# Patient Record
Sex: Female | Born: 1950 | Race: White | Hispanic: No | Smoking: Current every day smoker
Health system: Southern US, Community
[De-identification: ages and names within clinical notes are randomized; demographics above are authoritative.]

## PROBLEM LIST (undated history)

## (undated) DIAGNOSIS — Z9981 Dependence on supplemental oxygen: Secondary | ICD-10-CM

## (undated) DIAGNOSIS — I509 Heart failure, unspecified: Secondary | ICD-10-CM

## (undated) DIAGNOSIS — I2510 Atherosclerotic heart disease of native coronary artery without angina pectoris: Secondary | ICD-10-CM

## (undated) DIAGNOSIS — R519 Headache, unspecified: Secondary | ICD-10-CM

## (undated) DIAGNOSIS — R51 Headache: Secondary | ICD-10-CM

## (undated) DIAGNOSIS — G459 Transient cerebral ischemic attack, unspecified: Secondary | ICD-10-CM

## (undated) DIAGNOSIS — E119 Type 2 diabetes mellitus without complications: Secondary | ICD-10-CM

## (undated) DIAGNOSIS — D649 Anemia, unspecified: Secondary | ICD-10-CM

## (undated) DIAGNOSIS — I6350 Cerebral infarction due to unspecified occlusion or stenosis of unspecified cerebral artery: Secondary | ICD-10-CM

## (undated) DIAGNOSIS — E7800 Pure hypercholesterolemia, unspecified: Secondary | ICD-10-CM

## (undated) DIAGNOSIS — IMO0001 Reserved for inherently not codable concepts without codable children: Secondary | ICD-10-CM

## (undated) DIAGNOSIS — J189 Pneumonia, unspecified organism: Secondary | ICD-10-CM

## (undated) DIAGNOSIS — I639 Cerebral infarction, unspecified: Secondary | ICD-10-CM

## (undated) DIAGNOSIS — I10 Essential (primary) hypertension: Secondary | ICD-10-CM

## (undated) DIAGNOSIS — I219 Acute myocardial infarction, unspecified: Secondary | ICD-10-CM

## (undated) HISTORY — DX: Anemia, unspecified: D64.9

## (undated) HISTORY — PX: CORONARY ANGIOPLASTY WITH STENT PLACEMENT: SHX49

## (undated) HISTORY — PX: GLAUCOMA SURGERY: SHX656

## (undated) HISTORY — PX: EYE SURGERY: SHX253

## (undated) HISTORY — PX: CATARACT EXTRACTION W/ INTRAOCULAR LENS  IMPLANT, BILATERAL: SHX1307

## (undated) HISTORY — DX: Cerebral infarction due to unspecified occlusion or stenosis of unspecified cerebral artery: I63.50

## (undated) HISTORY — PX: LEG SURGERY: SHX1003

## (undated) HISTORY — PX: TUBAL LIGATION: SHX77

## (undated) HISTORY — DX: Heart failure, unspecified: I50.9

## (undated) HISTORY — DX: Transient cerebral ischemic attack, unspecified: G45.9

## (undated) HISTORY — DX: Atherosclerotic heart disease of native coronary artery without angina pectoris: I25.10

---

## 2008-08-30 DIAGNOSIS — I639 Cerebral infarction, unspecified: Secondary | ICD-10-CM | POA: Insufficient documentation

## 2008-08-30 HISTORY — DX: Cerebral infarction, unspecified: I63.9

## 2011-04-20 DIAGNOSIS — I739 Peripheral vascular disease, unspecified: Secondary | ICD-10-CM | POA: Insufficient documentation

## 2011-04-20 HISTORY — DX: Peripheral vascular disease, unspecified: I73.9

## 2011-12-01 DIAGNOSIS — H540X33 Blindness right eye category 3, blindness left eye category 3: Secondary | ICD-10-CM

## 2011-12-01 HISTORY — DX: Blindness right eye category 3, blindness left eye category 3: H54.0X33

## 2012-02-22 DIAGNOSIS — Z79899 Other long term (current) drug therapy: Secondary | ICD-10-CM

## 2012-02-22 HISTORY — DX: Other long term (current) drug therapy: Z79.899

## 2015-10-15 DIAGNOSIS — R600 Localized edema: Secondary | ICD-10-CM

## 2015-10-15 DIAGNOSIS — R609 Edema, unspecified: Secondary | ICD-10-CM

## 2015-10-15 HISTORY — DX: Edema, unspecified: R60.9

## 2015-10-15 HISTORY — DX: Localized edema: R60.0

## 2016-01-07 ENCOUNTER — Encounter (HOSPITAL_COMMUNITY): Payer: Self-pay | Admitting: General Practice

## 2016-01-07 ENCOUNTER — Inpatient Hospital Stay (HOSPITAL_COMMUNITY)
Admission: AD | Admit: 2016-01-07 | Discharge: 2016-01-10 | DRG: 125 | Disposition: A | Discharge status: Home / Self Care | Payer: Medicare HMO | Source: Other Acute Inpatient Hospital | Attending: Internal Medicine | Admitting: Internal Medicine

## 2016-01-07 DIAGNOSIS — Z9841 Cataract extraction status, right eye: Secondary | ICD-10-CM

## 2016-01-07 DIAGNOSIS — F17210 Nicotine dependence, cigarettes, uncomplicated: Secondary | ICD-10-CM | POA: Diagnosis present

## 2016-01-07 DIAGNOSIS — E1139 Type 2 diabetes mellitus with other diabetic ophthalmic complication: Secondary | ICD-10-CM | POA: Diagnosis not present

## 2016-01-07 DIAGNOSIS — H05019 Cellulitis of unspecified orbit: Secondary | ICD-10-CM | POA: Diagnosis present

## 2016-01-07 DIAGNOSIS — Z79899 Other long term (current) drug therapy: Secondary | ICD-10-CM | POA: Diagnosis not present

## 2016-01-07 DIAGNOSIS — Z794 Long term (current) use of insulin: Secondary | ICD-10-CM

## 2016-01-07 DIAGNOSIS — Z9981 Dependence on supplemental oxygen: Secondary | ICD-10-CM

## 2016-01-07 DIAGNOSIS — H5462 Unqualified visual loss, left eye, normal vision right eye: Secondary | ICD-10-CM | POA: Diagnosis present

## 2016-01-07 DIAGNOSIS — Z9842 Cataract extraction status, left eye: Secondary | ICD-10-CM

## 2016-01-07 DIAGNOSIS — Z7982 Long term (current) use of aspirin: Secondary | ICD-10-CM

## 2016-01-07 DIAGNOSIS — Z8673 Personal history of transient ischemic attack (TIA), and cerebral infarction without residual deficits: Secondary | ICD-10-CM

## 2016-01-07 DIAGNOSIS — I10 Essential (primary) hypertension: Secondary | ICD-10-CM | POA: Diagnosis present

## 2016-01-07 DIAGNOSIS — Z7902 Long term (current) use of antithrombotics/antiplatelets: Secondary | ICD-10-CM

## 2016-01-07 DIAGNOSIS — E113599 Type 2 diabetes mellitus with proliferative diabetic retinopathy without macular edema, unspecified eye: Secondary | ICD-10-CM | POA: Diagnosis present

## 2016-01-07 DIAGNOSIS — Z888 Allergy status to other drugs, medicaments and biological substances status: Secondary | ICD-10-CM | POA: Diagnosis not present

## 2016-01-07 DIAGNOSIS — H05012 Cellulitis of left orbit: Secondary | ICD-10-CM

## 2016-01-07 DIAGNOSIS — H409 Unspecified glaucoma: Secondary | ICD-10-CM | POA: Diagnosis present

## 2016-01-07 DIAGNOSIS — H00034 Abscess of left upper eyelid: Principal | ICD-10-CM | POA: Diagnosis present

## 2016-01-07 DIAGNOSIS — I2510 Atherosclerotic heart disease of native coronary artery without angina pectoris: Secondary | ICD-10-CM | POA: Diagnosis not present

## 2016-01-07 DIAGNOSIS — Z961 Presence of intraocular lens: Secondary | ICD-10-CM | POA: Diagnosis present

## 2016-01-07 DIAGNOSIS — E785 Hyperlipidemia, unspecified: Secondary | ICD-10-CM | POA: Diagnosis present

## 2016-01-07 DIAGNOSIS — Z833 Family history of diabetes mellitus: Secondary | ICD-10-CM

## 2016-01-07 DIAGNOSIS — Z955 Presence of coronary angioplasty implant and graft: Secondary | ICD-10-CM

## 2016-01-07 DIAGNOSIS — Z8614 Personal history of Methicillin resistant Staphylococcus aureus infection: Secondary | ICD-10-CM | POA: Diagnosis not present

## 2016-01-07 DIAGNOSIS — I252 Old myocardial infarction: Secondary | ICD-10-CM | POA: Diagnosis not present

## 2016-01-07 DIAGNOSIS — H578 Other specified disorders of eye and adnexa: Secondary | ICD-10-CM | POA: Diagnosis present

## 2016-01-07 HISTORY — DX: Atherosclerotic heart disease of native coronary artery without angina pectoris: I25.10

## 2016-01-07 HISTORY — DX: Headache, unspecified: R51.9

## 2016-01-07 HISTORY — DX: Type 2 diabetes mellitus without complications: E11.9

## 2016-01-07 HISTORY — DX: Dependence on supplemental oxygen: Z99.81

## 2016-01-07 HISTORY — DX: Cerebral infarction, unspecified: I63.9

## 2016-01-07 HISTORY — DX: Essential (primary) hypertension: I10

## 2016-01-07 HISTORY — DX: Acute myocardial infarction, unspecified: I21.9

## 2016-01-07 HISTORY — DX: Type 2 diabetes mellitus with other diabetic ophthalmic complication: E11.39

## 2016-01-07 HISTORY — DX: Cellulitis of unspecified orbit: H05.019

## 2016-01-07 HISTORY — DX: Pneumonia, unspecified organism: J18.9

## 2016-01-07 HISTORY — DX: Pure hypercholesterolemia, unspecified: E78.00

## 2016-01-07 HISTORY — DX: Reserved for inherently not codable concepts without codable children: IMO0001

## 2016-01-07 HISTORY — DX: Headache: R51

## 2016-01-07 LAB — BASIC METABOLIC PANEL
Anion gap: 7 (ref 5–15)
BUN: 10 mg/dL (ref 6–20)
CO2: 30 mmol/L (ref 22–32)
CREATININE: 0.75 mg/dL (ref 0.44–1.00)
Calcium: 9.1 mg/dL (ref 8.9–10.3)
Chloride: 99 mmol/L — ABNORMAL LOW (ref 101–111)
Glucose, Bld: 266 mg/dL — ABNORMAL HIGH (ref 65–99)
POTASSIUM: 4 mmol/L (ref 3.5–5.1)
SODIUM: 136 mmol/L (ref 135–145)

## 2016-01-07 LAB — CBC
HEMATOCRIT: 32.7 % — AB (ref 36.0–46.0)
Hemoglobin: 10.5 g/dL — ABNORMAL LOW (ref 12.0–15.0)
MCH: 30.1 pg (ref 26.0–34.0)
MCHC: 32.1 g/dL (ref 30.0–36.0)
MCV: 93.7 fL (ref 78.0–100.0)
PLATELETS: 175 10*3/uL (ref 150–400)
RBC: 3.49 MIL/uL — AB (ref 3.87–5.11)
RDW: 13.6 % (ref 11.5–15.5)
WBC: 5.9 10*3/uL (ref 4.0–10.5)

## 2016-01-07 LAB — MRSA PCR SCREENING: MRSA by PCR: POSITIVE — AB

## 2016-01-07 LAB — PROTIME-INR
INR: 1.01 (ref 0.00–1.49)
Prothrombin Time: 13.5 seconds (ref 11.6–15.2)

## 2016-01-07 LAB — GLUCOSE, CAPILLARY: Glucose-Capillary: 258 mg/dL — ABNORMAL HIGH (ref 65–99)

## 2016-01-07 MED ORDER — ACETAMINOPHEN 325 MG PO TABS
650.0000 mg | ORAL_TABLET | Freq: Four times a day (QID) | ORAL | Status: DC | PRN
  Filled 2016-01-07: qty 2

## 2016-01-07 MED ORDER — VANCOMYCIN HCL IN DEXTROSE 750-5 MG/150ML-% IV SOLN
750.0000 mg | Freq: Two times a day (BID) | INTRAVENOUS | Status: DC
Start: 2016-01-08 — End: 2016-01-10
  Administered 2016-01-08 – 2016-01-09 (×4): 750 mg via INTRAVENOUS
  Filled 2016-01-07 (×8): qty 150

## 2016-01-07 MED ORDER — HYDROCODONE-ACETAMINOPHEN 5-325 MG PO TABS
1.0000 | ORAL_TABLET | ORAL | Status: DC | PRN
  Administered 2016-01-07: 1 via ORAL
  Administered 2016-01-08 (×2): 2 via ORAL
  Administered 2016-01-08: 1 via ORAL
  Administered 2016-01-08 – 2016-01-10 (×7): 2 via ORAL
  Filled 2016-01-07: qty 2
  Filled 2016-01-07 (×2): qty 1
  Filled 2016-01-07 (×8): qty 2

## 2016-01-07 MED ORDER — ALBUTEROL SULFATE (2.5 MG/3ML) 0.083% IN NEBU
2.5000 mg | INHALATION_SOLUTION | Freq: Once | RESPIRATORY_TRACT | Status: AC
  Administered 2016-01-07: 2.5 mg via RESPIRATORY_TRACT

## 2016-01-07 MED ORDER — MORPHINE SULFATE (PF) 2 MG/ML IV SOLN: 2.0000 mg | INTRAVENOUS | Status: DC | PRN

## 2016-01-07 MED ORDER — PIPERACILLIN-TAZOBACTAM 3.375 G IVPB
3.3750 g | Freq: Three times a day (TID) | INTRAVENOUS | Status: DC
  Administered 2016-01-08 – 2016-01-10 (×7): 3.375 g via INTRAVENOUS
  Filled 2016-01-07 (×9): qty 50

## 2016-01-07 MED ORDER — SODIUM CHLORIDE 0.9 % IV SOLN
INTRAVENOUS | Status: DC
  Administered 2016-01-07: 21:00:00 via INTRAVENOUS

## 2016-01-07 MED ORDER — ONDANSETRON HCL 4 MG PO TABS: 4.0000 mg | ORAL_TABLET | Freq: Four times a day (QID) | ORAL | Status: DC | PRN

## 2016-01-07 MED ORDER — PIPERACILLIN-TAZOBACTAM 3.375 G IVPB 30 MIN
3.3750 g | Freq: Once | INTRAVENOUS | Status: AC
  Administered 2016-01-07: 3.375 g via INTRAVENOUS
  Filled 2016-01-07: qty 50

## 2016-01-07 MED ORDER — INSULIN ASPART 100 UNIT/ML ~~LOC~~ SOLN
0.0000 [IU] | SUBCUTANEOUS | Status: DC
  Administered 2016-01-07: 5 [IU] via SUBCUTANEOUS
  Administered 2016-01-08: 2 [IU] via SUBCUTANEOUS
  Administered 2016-01-08 (×3): 3 [IU] via SUBCUTANEOUS
  Administered 2016-01-08: 1 [IU] via SUBCUTANEOUS
  Administered 2016-01-08: 3 [IU] via SUBCUTANEOUS
  Administered 2016-01-09: 1 [IU] via SUBCUTANEOUS
  Administered 2016-01-09: 2 [IU] via SUBCUTANEOUS
  Administered 2016-01-09: 1 [IU] via SUBCUTANEOUS

## 2016-01-07 MED ORDER — CETYLPYRIDINIUM CHLORIDE 0.05 % MT LIQD
7.0000 mL | Freq: Two times a day (BID) | OROMUCOSAL | Status: DC
  Administered 2016-01-07: 7 mL via OROMUCOSAL

## 2016-01-07 MED ORDER — ACETAMINOPHEN 650 MG RE SUPP: 650.0000 mg | Freq: Four times a day (QID) | RECTAL | Status: DC | PRN

## 2016-01-07 MED ORDER — ONDANSETRON HCL 4 MG/2ML IJ SOLN: 4.0000 mg | Freq: Four times a day (QID) | INTRAMUSCULAR | Status: DC | PRN

## 2016-01-07 MED ORDER — VANCOMYCIN HCL IN DEXTROSE 1-5 GM/200ML-% IV SOLN
1000.0000 mg | Freq: Once | INTRAVENOUS | Status: AC
  Administered 2016-01-07: 1000 mg via INTRAVENOUS
  Filled 2016-01-07: qty 200

## 2016-01-07 MED ORDER — ALBUTEROL SULFATE (2.5 MG/3ML) 0.083% IN NEBU
2.5000 mg | INHALATION_SOLUTION | RESPIRATORY_TRACT | Status: DC | PRN
  Administered 2016-01-08: 2.5 mg via RESPIRATORY_TRACT
  Filled 2016-01-07 (×2): qty 3

## 2016-01-07 NOTE — H&P (Signed)
Brooke Hudson [id number] DOB: 11/13/1950 DOA: 01/07/2016     PCP: Charline Bills, MD    Patient coming from: [facility]  Chief Complaint: Eye swelling  HPI: Brooke Hudson is a 65 y.o. female with medical history significant of DM, stroke, HTN, CAD, MRSA abcess    Presented with 4-5 days of Left eye swelling low grade fever no chills associated with significant pain. She had a small pimple in the corner of the eye her grand Daughter mashed on it. Next day she presented to Er it was I&D but next day got worse had to go back to ER and I&D eye lid CT scan showed abscess. Started on [employer] was consultedShe was sent to [employer] [facility]    Regarding pertinent Chronic problems: DM last Hg A1C was 8.7 not under goo control reports hx  Of stroke in 2010 no residual deficits.  hx of cellulitis with MRSA years ago. HX of CAD with coronary stent 15 years ago have been doing well asymptomatic since.  Continues to smoke may have mild COPD and have been started on albuterol PRN      Hospitalist was called for admission for Orbital celluliits  Review of Systems:    Pertinent positives include: Fevers, shortness of breath at rest, wheezing.  Constitutional:  No weight loss, night sweats, chills, fatigue, weight loss  HEENT:  No headaches, Difficulty swallowing,Tooth/dental problems,Sore throat,  No sneezing, itching, ear ache, nasal congestion, post nasal drip,  Cardio-vascular:  No chest pain, Orthopnea, PND, anasarca, dizziness, palpitations.no Bilateral lower extremity swelling  GI:  No heartburn, indigestion, abdominal pain, nausea, vomiting, diarrhea, change in bowel habits, loss of appetite, melena, blood in stool, hematemesis Resp:  no . No dyspnea on exertion, No excess mucus, no productive cough, No non-productive cough, No coughing up of blood.No change in color of mucus.No  Skin:  no rash or lesions. No jaundice GU:  no dysuria, change in color of  urine, no urgency or frequency. No straining to urinate.  No flank pain.  Musculoskeletal:  No joint pain or no joint swelling. No decreased range of motion. No back pain.  Psych:  No change in mood or affect. No depression or anxiety. No memory loss.  Neuro: no localizing neurological complaints, no tingling, no weakness, no double vision, no gait abnormality, no slurred speech, no confusion  As per HPI otherwise 10 point review of systems negative.   Past Medical History: Past Medical History  Diagnosis Date  . Hypertension   . Hypercholesterolemia   . Coronary artery disease   . Myocardial infarction ([place]) ~ 2005  . Shortness of breath dyspnea     "alot" (01/07/2016)  . Pneumonia ~ 2016  . On home oxygen therapy     "2L just at night" (01/07/2016)  . Type II diabetes mellitus ([place])   . Daily headache     "lately daily; normally it's weekly" (01/07/2016)   Past Surgical History  Procedure Laterality Date  . Eye surgery    . Glaucoma surgery Bilateral   . Cataract extraction w/ intraocular lens  implant, bilateral Bilateral   . Cesarean section  1980  . Tubal ligation    . Coronary angioplasty with stent placement  ~ 2005 X 2    "put 5 stents in w/in 1 wk; [place]"     Social History:  Ambulatory   walker  Lives at home  With family     reports that she has been smoking  Cigarettes.  She has a 18 pack-year smoking history. She has never used smokeless tobacco. She reports that she does not drink alcohol or use illicit drugs.  Allergies:   Allergies  Allergen Reactions  . Bactrim [Sulfamethoxazole-Trimethoprim] Other (See Comments)    Dehydrates and disorients/ delusions  . Benzocaine Rash       Family History:  Family History  Problem Relation Age of Onset  . Diabetes Mother   . Diabetes Sister   . Diabetes Other     Medications: Prior to Admission medications   Not on File    Physical Exam: Patient Vitals for the past 24 hrs:  BP Temp Temp  src Pulse Resp SpO2 Height Weight  01/07/16 1826 (!) 136/45 mmHg 98.2 F (36.8 C) Axillary 73 19 92 % 5\' 2"  (1.575 m) 74.844 kg (165 lb)    1. General:  in No Acute distress 2. Psychological: Alert and  Oriented 3. Head/ENT:    Dry Mucous Membranes                          Head Non traumatic, neck supple                          Normal  Dentition 4. SKIN: normal   Skin turgor,  Skin clean Dry with redness, swelling of left eyelid    5. Heart: Regular rate and rhythm   Murmur, Rub or gallop 6. Lungs:  no wheezes or crackles, distant breathsounds 7. Abdomen: Soft, non-tender, Non distended 8. Lower extremities: no clubbing, cyanosis, or edema 9. Neurologically Grossly intact, moving all 4 extremities equally 10. MSK: Normal range of motion   body mass index is 30.17 kg/(m^2).  Labs on Admission:   Labs on Admission: I have personally reviewed following labs and imaging studies  CBC: No results for input(s): WBC, NEUTROABS, HGB, HCT, MCV, PLT in the last 168 hours. Basic Metabolic Panel: No results for input(s): NA, K, CL, CO2, GLUCOSE, BUN, CREATININE, CALCIUM, MG, PHOS in the last 168 hours. GFR: CrCl cannot be calculated (Patient has no serum creatinine result on file.). Liver Function Tests: No results for input(s): AST, ALT, ALKPHOS, BILITOT, PROT, ALBUMIN in the last 168 hours. No results for input(s): LIPASE, AMYLASE in the last 168 hours. No results for input(s): AMMONIA in the last 168 hours. Coagulation Profile: No results for input(s): INR, PROTIME in the last 168 hours. Cardiac Enzymes: No results for input(s): CKTOTAL, CKMB, CKMBINDEX, TROPONINI in the last 168 hours. BNP (last 3 results) No results for input(s): PROBNP in the last 8760 hours. HbA1C: No results for input(s): HGBA1C in the last 72 hours. CBG: No results for input(s): GLUCAP in the last 168 hours. Lipid Profile: No results for input(s): CHOL, HDL, LDLCALC, TRIG, CHOLHDL, LDLDIRECT in the last  72 hours. Thyroid Function Tests: No results for input(s): TSH, T4TOTAL, FREET4, T3FREE, THYROIDAB in the last 72 hours. Anemia Panel: No results for input(s): VITAMINB12, FOLATE, FERRITIN, TIBC, IRON, RETICCTPCT in the last 72 hours. Urine analysis: No results found for: COLORURINE, APPEARANCEUR, LABSPEC, PHURINE, GLUCOSEU, HGBUR, BILIRUBINUR, KETONESUR, PROTEINUR, UROBILINOGEN, NITRITE, LEUKOCYTESUR Sepsis Labs: @LABRCNTIP (procalcitonin:4,lacticidven:4) )No results found for this or any previous visit (from the past 240 hour(s)).     No results found for: HGBA1C  CrCl cannot be calculated (Patient has no serum creatinine result on file.).  BNP (last 3 results) No results for input(s): PROBNP in the last 8760  hours.   ECG REPORT Not obtained  Filed Weights   01/07/16 1826  Weight: 74.844 kg (165 lb)     Cultures: No results found for: SDES, SPECREQUEST, CULT, REPTSTATUS   Radiological Exams on Admission: No results found.  Chart has been reviewed    Assessment/Plan  65 y.o. female with medical history significant of DM, stroke, HTN, CAD, MRSA abscess here with Left eyelid cellulitis/abcess   Present on Admission:  . Orbital cellulitis  - discussed with opthalmology, continue with IV Sent in Zosyn obtained blood culture and MRSA plan to observe on IV antibiotics if continues to get worse or does not improve will need receiving to mention the next few days  . Essential hypertension - continue home medications, Patient unsure what she takes for now will restart amlodipine but will need to confirm her home medication list  . DM type 2 causing eye disease, not at goal [facility]) with by mouth medications order sliding scale  . CAD (coronary artery disease), native coronary artery currently stable, continue home medications  Other plan as per orders.  DVT prophylaxis:  SCD    Code Status:  FULL CODE as per patient   Family Communication:   Family  at  Bedside  plan of care  was discussed with grand Daughter Aldona Bar  Disposition Plan:    To home once workup is complete and patient is stable   Consults called: opthalmology    Admission status:   inpatient       Level of care     medical floor        I have spent a total of 65 min on this admission   extra time was spent to discuss case with Dr. Roselee Nova 01/07/2016, 8:46 PM    [facility]  Pager [phone]   after 2 AM please page floor coverage PA If 7AM-7PM, please contact the day team taking care of the patient  [email or web address]  Password [email or web address]

## 2016-01-07 NOTE — Progress Notes (Signed)
Pharmacy Antibiotic Note  Brooke Hudson is a 65 y.o. female admitted on 01/07/2016 with cellulitis.  Pharmacy has been consulted for vancomycin and zosyn dosing. Pt presents with eye swelling.  Pt received vancomycin 1g and zosyn 3.375g IV once.  Plan: Vancomycin 750mg  IV every 12 hours.  Goal trough 10-15 mcg/mL. Zosyn 3.375g IV q8h (4 hour infusion).  Monitor culture data, renal function and clinical course VT at SS prn  Height: 5\' 2"  (157.5 cm) Weight: 165 lb (74.844 kg) IBW/kg (Calculated) : 50.1  Temp (24hrs), Avg:98.2 F (36.8 C), Min:98.2 F (36.8 C), Max:98.2 F (36.8 C)  No results for input(s): WBC, CREATININE, LATICACIDVEN, VANCOTROUGH, VANCOPEAK, VANCORANDOM, GENTTROUGH, GENTPEAK, GENTRANDOM, TOBRATROUGH, TOBRAPEAK, TOBRARND, AMIKACINPEAK, AMIKACINTROU, AMIKACIN in the last 168 hours.  CrCl cannot be calculated (Patient has no serum creatinine result on file.).    Allergies  Allergen Reactions  . Bactrim [Sulfamethoxazole-Trimethoprim] Other (See Comments)    Dehydrates and disorients/ delusions  . Benzocaine Rash    Antimicrobials this admission: Vanc 5/10 >>  Zosyn 5/10 >>   Dose adjustments this admission: n/a  Microbiology results:  BCx:   UCx:    Sputum:   5/10 MRSA PCR: sent   Andrey Cota. Diona Foley, PharmD, [place] Clinical Pharmacist Pager [phone] 01/07/2016 7:57 PM

## 2016-01-08 ENCOUNTER — Other Ambulatory Visit: Payer: Self-pay | Admitting: Ophthalmology

## 2016-01-08 LAB — CBC
HCT: 31.1 % — ABNORMAL LOW (ref 36.0–46.0)
HEMATOCRIT: 30.6 % — AB (ref 36.0–46.0)
HEMATOCRIT: 33.5 % — AB (ref 36.0–46.0)
HEMATOCRIT: 33 % — AB (ref 36.0–46.0)
HEMOGLOBIN: 10.8 g/dL — AB (ref 12.0–15.0)
Hemoglobin: 10.4 g/dL — ABNORMAL LOW (ref 12.0–15.0)
Hemoglobin: 10 g/dL — ABNORMAL LOW (ref 12.0–15.0)
Hemoglobin: 11 g/dL — ABNORMAL LOW (ref 12.0–15.0)
MCH: 31.6 pg (ref 26.0–34.0)
MCH: 30.6 pg (ref 26.0–34.0)
MCH: 31.2 pg (ref 26.0–34.0)
MCH: 31.3 pg (ref 26.0–34.0)
MCHC: 33.4 g/dL (ref 30.0–36.0)
MCHC: 32.7 g/dL (ref 30.0–36.0)
MCHC: 32.8 g/dL (ref 30.0–36.0)
MCHC: 32.7 g/dL (ref 30.0–36.0)
MCV: 94.5 fL (ref 78.0–100.0)
MCV: 93.6 fL (ref 78.0–100.0)
MCV: 94.9 fL (ref 78.0–100.0)
MCV: 95.7 fL (ref 78.0–100.0)
PLATELETS: 166 10*3/uL (ref 150–400)
PLATELETS: 162 10*3/uL (ref 150–400)
PLATELETS: 184 10*3/uL (ref 150–400)
Platelets: 180 10*3/uL (ref 150–400)
RBC: 3.29 MIL/uL — ABNORMAL LOW (ref 3.87–5.11)
RBC: 3.27 MIL/uL — ABNORMAL LOW (ref 3.87–5.11)
RBC: 3.53 MIL/uL — ABNORMAL LOW (ref 3.87–5.11)
RBC: 3.45 MIL/uL — AB (ref 3.87–5.11)
RDW: 13.5 % (ref 11.5–15.5)
RDW: 13.5 % (ref 11.5–15.5)
RDW: 13.4 % (ref 11.5–15.5)
RDW: 13.5 % (ref 11.5–15.5)
WBC: 6.1 10*3/uL (ref 4.0–10.5)
WBC: 5.9 10*3/uL (ref 4.0–10.5)
WBC: 6.2 10*3/uL (ref 4.0–10.5)
WBC: 6 10*3/uL (ref 4.0–10.5)

## 2016-01-08 LAB — BASIC METABOLIC PANEL
ANION GAP: 10 (ref 5–15)
ANION GAP: 11 (ref 5–15)
Anion gap: 11 (ref 5–15)
BUN: 9 mg/dL (ref 6–20)
BUN: 8 mg/dL (ref 6–20)
BUN: 7 mg/dL (ref 6–20)
CALCIUM: 8.9 mg/dL (ref 8.9–10.3)
CALCIUM: 9 mg/dL (ref 8.9–10.3)
CALCIUM: 8.9 mg/dL (ref 8.9–10.3)
CHLORIDE: 101 mmol/L (ref 101–111)
CO2: 27 mmol/L (ref 22–32)
CO2: 27 mmol/L (ref 22–32)
CO2: 28 mmol/L (ref 22–32)
CREATININE: 0.72 mg/dL (ref 0.44–1.00)
CREATININE: 0.63 mg/dL (ref 0.44–1.00)
Chloride: 100 mmol/L — ABNORMAL LOW (ref 101–111)
Chloride: 101 mmol/L (ref 101–111)
Creatinine, Ser: 0.69 mg/dL (ref 0.44–1.00)
GFR calc Af Amer: >60 mL/min (ref >60)
GFR calc Af Amer: >60 mL/min (ref >60)
GFR calc Af Amer: >60 mL/min (ref >60)
GFR calc non Af Amer: >60 mL/min (ref >60)
GFR calc non Af Amer: >60 mL/min (ref >60)
GLUCOSE: 229 mg/dL — AB (ref 65–99)
GLUCOSE: 162 mg/dL — AB (ref 65–99)
GLUCOSE: 254 mg/dL — AB (ref 65–99)
Potassium: 3.8 mmol/L (ref 3.5–5.1)
Potassium: 4 mmol/L (ref 3.5–5.1)
Potassium: 4.2 mmol/L (ref 3.5–5.1)
Sodium: 137 mmol/L (ref 135–145)
Sodium: 139 mmol/L (ref 135–145)
Sodium: 140 mmol/L (ref 135–145)

## 2016-01-08 LAB — COMPREHENSIVE METABOLIC PANEL
ALBUMIN: 2.4 g/dL — AB (ref 3.5–5.0)
ALT: 11 U/L — AB (ref 14–54)
AST: 11 U/L — AB (ref 15–41)
Alkaline Phosphatase: 79 U/L (ref 38–126)
Anion gap: 8 (ref 5–15)
BUN: 8 mg/dL (ref 6–20)
CO2: 30 mmol/L (ref 22–32)
CREATININE: 0.68 mg/dL (ref 0.44–1.00)
Calcium: 8.9 mg/dL (ref 8.9–10.3)
Chloride: 102 mmol/L (ref 101–111)
GFR calc Af Amer: >60 mL/min (ref >60)
GLUCOSE: 154 mg/dL — AB (ref 65–99)
POTASSIUM: 3.7 mmol/L (ref 3.5–5.1)
Sodium: 140 mmol/L (ref 135–145)
Total Bilirubin: 0.4 mg/dL (ref 0.3–1.2)
Total Protein: 6.5 g/dL (ref 6.5–8.1)

## 2016-01-08 LAB — GLUCOSE, CAPILLARY
GLUCOSE-CAPILLARY: 143 mg/dL — AB (ref 65–99)
GLUCOSE-CAPILLARY: 229 mg/dL — AB (ref 65–99)
GLUCOSE-CAPILLARY: 212 mg/dL — AB (ref 65–99)
GLUCOSE-CAPILLARY: 217 mg/dL — AB (ref 65–99)
Glucose-Capillary: 216 mg/dL — ABNORMAL HIGH (ref 65–99)
Glucose-Capillary: 151 mg/dL — ABNORMAL HIGH (ref 65–99)

## 2016-01-08 LAB — TSH: TSH: 3.151 u[IU]/mL (ref 0.350–4.500)

## 2016-01-08 LAB — MAGNESIUM: Magnesium: 2.1 mg/dL (ref 1.7–2.4)

## 2016-01-08 LAB — PHOSPHORUS: Phosphorus: 2.8 mg/dL (ref 2.5–4.6)

## 2016-01-08 MED ORDER — MUPIROCIN 2 % EX OINT
1.0000 "application " | TOPICAL_OINTMENT | Freq: Two times a day (BID) | CUTANEOUS | Status: DC
  Administered 2016-01-08 – 2016-01-09 (×4): 1 via NASAL
  Filled 2016-01-08: qty 22

## 2016-01-08 MED ORDER — ERYTHROMYCIN 5 MG/GM OP OINT
TOPICAL_OINTMENT | Freq: Three times a day (TID) | OPHTHALMIC | Status: DC
  Administered 2016-01-08: 1 via OPHTHALMIC
  Administered 2016-01-09 – 2016-01-10 (×5): via OPHTHALMIC
  Filled 2016-01-08 (×2): qty 3.5

## 2016-01-08 MED ORDER — DIPHENHYDRAMINE HCL 25 MG PO CAPS
25.0000 mg | ORAL_CAPSULE | Freq: Four times a day (QID) | ORAL | Status: DC | PRN
  Administered 2016-01-08 – 2016-01-10 (×4): 25 mg via ORAL
  Filled 2016-01-08 (×4): qty 1

## 2016-01-08 MED ORDER — AMLODIPINE BESYLATE 5 MG PO TABS
5.0000 mg | ORAL_TABLET | Freq: Every day | ORAL | Status: DC
  Administered 2016-01-08 – 2016-01-10 (×3): 5 mg via ORAL
  Filled 2016-01-08 (×3): qty 1

## 2016-01-08 MED ORDER — CHLORHEXIDINE GLUCONATE CLOTH 2 % EX PADS
6.0000 | MEDICATED_PAD | Freq: Every day | CUTANEOUS | Status: DC
  Administered 2016-01-08 – 2016-01-10 (×3): 6 via TOPICAL

## 2016-01-08 MED ORDER — ERYTHROMYCIN 5 MG/GM OP OINT: TOPICAL_OINTMENT | Freq: Three times a day (TID) | OPHTHALMIC | Status: AC

## 2016-01-08 NOTE — Progress Notes (Signed)
Patient ID: Brooke Hudson, female   DOB: 1951/07/29, 65 y.o.   MRN: [id number]    PROGRESS NOTE    Brooke Hudson  [id number] DOB: 1951/02/13 DOA: 01/07/2016  PCP: Charline Bills, MD   Brief Narrative:  65 y.o. female with DM, stroke in 2010 with no residual deficits, HTN, CAD with coronary stent placed 15 yrs ago and no specific problems since that time, MRSA abscess, presented to [facility] ED with main concern of 4-5 days duration of progressively worsening left eye swelling, associated with fevers, chills, pain and itching. She apparently had CT of the eye done and was notable for an abscess (not uploaded in the system yet). Pt was started on vancomycin and zosyn and ophthalmology consulted. [facility] asked to admit for further evaluation.   Assessment & Plan:   Active Problems:   Orbital cellulitis, left - severe with significant surrounding erythema and edema also extending around right orbital area  - continue vancomycin and zosyn day #2 - follow up on blood cultures - emphasized importance of glucose control - ophthalmologist on call to review images once uploaded into the system, order placed to have the images imported STAT - NPO after midnight in case surgical intervention is required  - pt made aware of the plan     Essential hypertension - continue Norvasc as per home medical regimen     DM type 2 causing eye disease, not at goal [facility]) - continue SSI for now     CAD (coronary artery disease), native coronary artery - asymptomatic   DVT prophylaxis: SCD's Code Status: Full  Family Communication: Patient at bedside  Disposition Plan: Home once ophthalmology team clears   Consultants:   Ophthalmology    Procedures:   None  Antimicrobials:   Vancomycin 5/10 -->  Zosyn 5/10 -->  Subjective: Pt reports significant left eye pain and itching but says it feels better overall.   Objective: Filed Vitals:   01/07/16 1826 01/07/16 2231 01/07/16 2331 01/07/16 2347  BP:  136/45  105/54 162/50  Pulse: 73  71 78  Temp: 98.2 F (36.8 C)  98 F (36.7 C) 98.9 F (37.2 C)  TempSrc: Axillary  Axillary Oral  Resp: 19  19 19   Height: 5\' 2"  (1.575 m)     Weight: 74.844 kg (165 lb)     SpO2: 92% 95% 97% 92%    Intake/Output Summary (Last 24 hours) at 01/08/16 1424 Last data filed at 01/08/16 1100  Gross per 24 hour  Intake 1398.75 ml  Output   1626 ml  Net -227.25 ml   Filed Weights   01/07/16 1826  Weight: 74.844 kg (165 lb)    Examination:  General exam: Appears calm and comfortable  Respiratory system: Clear to auscultation. Respiratory effort normal. Cardiovascular system: S1 & S2 heard, RRR. No JVD, rubs, gallops or clicks. No pedal edema. Gastrointestinal system: Abdomen is nondistended, soft and nontender. No organomegaly or masses felt. Normal bowel sounds heard. Eyes: left orbital cellulitis with some drainage noted, TTP, some erythema in right maxillary and frontal area   Psychiatry: Judgement and insight appear normal. Mood & affect appropriate.   Data Reviewed: I have personally reviewed following labs and imaging studies  CBC:  Recent Labs Lab 01/07/16 1920 01/08/16 0104 01/08/16 0319 01/08/16 0834  WBC 5.9 6.1 5.9 6.2  HGB 10.5* 10.4* 10.0* 11.0*  HCT 32.7* 31.1* 30.6* 33.5*  MCV 93.7 94.5 93.6 94.9  PLT 175 166 162 [id number]   Basic Metabolic Panel:  Recent Labs Lab 01/07/16 1920 01/07/16 2305 01/08/16 0319 01/08/16 0834 01/08/16 1123  NA 136 137 140 139 140  K 4.0 3.8 3.7 4.0 4.2  CL 99* 100* 102 101 101  CO2 30 27 30 27 28   GLUCOSE 266* 229* 154* 162* 254*  BUN 10 9 8 8 7   CREATININE 0.75 0.72 0.68 0.69 0.63  CALCIUM 9.1 8.9 8.9 9.0 8.9  MG  --   --  2.1  --   --   PHOS  --   --  2.8  --   --    Liver Function Tests:  Recent Labs Lab 01/08/16 0319  AST 11*  ALT 11*  ALKPHOS 79  BILITOT 0.4  PROT 6.5  ALBUMIN 2.4*   Coagulation Profile:  Recent Labs Lab 01/07/16 1920  INR 1.01   CBG:  Recent  Labs Lab 01/07/16 2122 01/08/16 0044 01/08/16 0421 01/08/16 0751 01/08/16 1127  GLUCAP 258* 216* 143* 151* 229*   Thyroid Function Tests:  Recent Labs  01/08/16 0319  TSH 3.151   Recent Results (from the past 240 hour(s))  MRSA PCR Screening     Status: Abnormal   Collection Time: 01/07/16  6:45 PM  Result Value Ref Range Status   MRSA by PCR POSITIVE (A) NEGATIVE Final  Culture, blood (Routine X 2) w Reflex to ID Panel     Status: None (Preliminary result)   Collection Time: 01/07/16  9:15 PM  Result Value Ref Range Status   Specimen Description BLOOD RIGHT ANTECUBITAL  Final   Culture PENDING  Incomplete   Report Status PENDING  Incomplete     Radiology Studies: No results found.  Scheduled Meds: . amLODipine  5 mg Oral Daily  . Chlorhexidine Gluconate Cloth  6 each Topical Q0600  . insulin aspart  0-9 Units Subcutaneous Q4H  . mupirocin ointment  1 application Nasal BID  . piperacillin-tazobactam (ZOSYN)  IV  3.375 g Intravenous Q8H  . vancomycin  750 mg Intravenous Q12H     LOS: 1 day   Time spent: 20 minutes   Faye Ramsay, MD [facility] Pager [phone]  If 7PM-7AM, please contact night-coverage [email or web address] Password [facility] 01/08/2016, 2:24 PM

## 2016-01-08 NOTE — Consult Note (Addendum)
Ophthalmology Consultation    Consult requested by: Dr. Toy Baker  Reason for consultation: Left upper lid cellulits with prior abscess drainage - decreased induration  HPI: 3 days prior the patient's grandaughter burst with pressure a 'whitehead' and the next day the upper lid was swollen. 2 days prior the patient presented to the ED and drainage of an abscess was performed in the ED. The following day, the swelling and redness worsened. The patient was transferred to [facility] for ophthalmic evaluation and possible surgical drainange of the abscess. In the past 24 hours there is decreased drainage  Pertinent Medical History:  Active Ambulatory Problems   Diagnosis Date Noted  . No Active Ambulatory Problems   Resolved Ambulatory Problems   Diagnosis Date Noted  . No Resolved Ambulatory Problems   Past Medical History  Diagnosis Date  . Hypertension   . Hypercholesterolemia   . Coronary artery disease   . Myocardial infarction ([place]) ~ 2005  . Shortness of breath dyspnea   . Pneumonia ~ 2016  . On home oxygen therapy   . Type II diabetes mellitus ([place])   . Daily headache   . Stroke (cerebrum) [facility]) 2010     Pertinent Ophthalmic History: Vision loss from diabetic retinopathy  Current Eye Medications: none - patient was started on Unasyn and Vancomycin IV upon admission to [facility] and previously was treated with Clindamycin  Systemic medications on admission:  Medications Prior to Admission  Medication Sig Dispense Refill  . albuterol (PROVENTIL HFA;VENTOLIN HFA) 108 (90 Base) MCG/ACT inhaler Inhale 1 puff into the lungs every 6 (six) hours as needed for wheezing or shortness of breath.    Marland Kitchen amLODipine (NORVASC) 5 MG tablet Take 5 mg by mouth daily.    Marland Kitchen aspirin EC 81 MG tablet Take 81 mg by mouth daily.    Marland Kitchen atenolol (TENORMIN) 50 MG tablet Take 50  mg by mouth daily.    . cilostazol (PLETAL) 100 MG tablet Take 100 mg by mouth 2 (two) times daily.    . clopidogrel (PLAVIX) 75 MG tablet Take 75 mg by mouth daily.    . furosemide (LASIX) 20 MG tablet Take 20 mg by mouth daily.    . Insulin Degludec (TRESIBA FLEXTOUCH) 100 UNIT/ML SOPN Inject 50 Units into the skin at bedtime.    Marland Kitchen lubiprostone (AMITIZA) 8 MCG capsule Take 8 mcg by mouth 2 (two) times daily with a meal.    . pantoprazole (PROTONIX) 40 MG tablet Take 40 mg by mouth daily.    . rosuvastatin (CRESTOR) 20 MG tablet Take 20 mg by mouth daily.    . sitaGLIPtin (JANUVIA) 100 MG tablet Take 100 mg by mouth daily.       ROS: Eyes: No pain on eye movement, tenderness of left upper eyelid, otherwise non-contributory  Visual Fields: no vision in left eye  Pupils: Not visible due to extent of left upper lid swelling and induration    TA: Deferred due to access to the left globe   Dilation: No view was possible due to induration and inability to visualize the pupil  External: OD: Normal  OS: Left upper lid induration and swelling with eschar over lid from prior abscess drainage Photo was taken and documented the external findings - decreased induration  Anterior segment exam: By penlight Appeared normal from limited view of pupil through very indurated  Conjunctiva: OD: Quiet  OS: Quiet   Cornea:   OD: Clear   OS: Clear  Anterior Chamber:  OD: Deep/quiet   OS: Difficult view   Iris:   OD: Normal   OS: Appears normal   Lens:   OD: Clear   OS: Clear   Optic disc: OD: deferred  OS: No view   Central retina--examined with indirect ophthalmoscope: OD: deferred  OS: No view   Peripheral retina--examined with indirect ophthalmoscope with lid speculum and  scleral depression:  OD: deferred OS: No view   Impression:  Severe left upper lid pre-septal cellulitis with prior abscess drainage on Unasyn and Vancomycin IV antibiotics - decreased induration. Severe proliferative diabetic retinopathy with vision loss left eye   Recommendations/Plan:  Continue IV antibiotics and reevaluate in another 12 hours.  Consider I&D if continued or worsened induration.  Procedure:  Bedside drainage of abscess performed through upper aspect of eyelid using sterile gauze and sterile cotton swabs.  Approximately 1 cc of purulent bloody pus was drained without complication.  Erythromycin was ordered for application over open are 3 times a day.  We will reevaluate in 12 hours and consider surgical drainage if no improvement.    I've discussed these findings with the nurse and/or resident. Please contact our office with any questions or concerns at [phone]. Thank you for calling us to care for this nice lady.  Royston Cowper     CT scan reviewed - abscess noted on scan. - watch closely for improvement.

## 2016-01-08 NOTE — Consult Note (Signed)
Brooke Hudson                                                                               01/08/2016                                               Pediatric Ophthalmology Consultation                                         Consult requested by: Dr. Toy Baker  Reason for consultation:  Left upper lid cellulits with prior abscess drainage  HPI: 3 days prior the patient's grandaughter burst with pressure a 'whitehead' and the next day the upper lid was swollen.  2 days prior the patient presented to the ED and drainage of an abscess was performed in the ED.  The following day, the swelling and redness worsened.  The patient was transferred to [facility] for ophthalmic evaluation and possible surgical drainange of the abscess.  Pertinent Medical History:   Active Ambulatory Problems    Diagnosis Date Noted  . No Active Ambulatory Problems   Resolved Ambulatory Problems    Diagnosis Date Noted  . No Resolved Ambulatory Problems   Past Medical History  Diagnosis Date  . Hypertension   . Hypercholesterolemia   . Coronary artery disease   . Myocardial infarction ([place]) ~ 2005  . Shortness of breath dyspnea   . Pneumonia ~ 2016  . On home oxygen therapy   . Type II diabetes mellitus ([place])   . Daily headache   . Stroke (cerebrum) [facility]) 2010      Pertinent Ophthalmic History: Vision loss from diabetic retinopathy  Current Eye Medications: none - patient was started on Unasyn and Vancomycin IV upon admission to [facility] and previously was treated with Clindamycin  Systemic medications on admission:   Medications Prior to Admission  Medication Sig Dispense Refill  . albuterol (PROVENTIL HFA;VENTOLIN HFA) 108 (90 Base) MCG/ACT inhaler Inhale 1 puff into the lungs every 6 (six) hours as needed for wheezing or shortness of breath.    Marland Kitchen amLODipine (NORVASC) 5 MG tablet Take 5 mg by mouth daily.    Marland Kitchen aspirin EC 81 MG tablet Take 81 mg by mouth daily.    Marland Kitchen atenolol (TENORMIN) 50  MG tablet Take 50 mg by mouth daily.    . cilostazol (PLETAL) 100 MG tablet Take 100 mg by mouth 2 (two) times daily.    . clopidogrel (PLAVIX) 75 MG tablet Take 75 mg by mouth daily.    . furosemide (LASIX) 20 MG tablet Take 20 mg by mouth daily.    . Insulin Degludec (TRESIBA FLEXTOUCH) 100 UNIT/ML SOPN Inject 50 Units into the skin at bedtime.    Marland Kitchen lubiprostone (AMITIZA) 8 MCG capsule Take 8 mcg by mouth 2 (two) times daily with a meal.    . pantoprazole (PROTONIX) 40 MG tablet Take 40 mg by mouth daily.    Marland Kitchen  rosuvastatin (CRESTOR) 20 MG tablet Take 20 mg by mouth daily.    . sitaGLIPtin (JANUVIA) 100 MG tablet Take 100 mg by mouth daily.          ROS: Eyes:  No pain on eye movement, tenderness of left upper eyelid, otherwise non-contributory  Visual Fields: no vision in left eye   Pupils:  Not visible due to extent of left upper lid swelling and induration    TA:       Deferred due to access to the left globe   Dilation:  No view was possible due to induration and inability to visualize the pupil  External:   OD:  Normal    OS:  Left upper lid induration and swelling with eschar over lid from prior abscess drainage Photo was taken and documented the external findings  Anterior segment exam:  By penlight   Appeared normal from limited view of pupil through very indurated  Conjunctiva:  OD:  Quiet     OS:  Quiet     Cornea:    OD: Clear    OS: Clear      Anterior Chamber:   OD:  Deep/quiet      OS:  Difficult view    Iris:    OD:  Normal       OS:  Appears normal     Lens:    OD:  Clear         OS:  Clear          Optic disc:  OD:  deferred    OS:  No view    Central retina--examined with indirect ophthalmoscope:  OD:  deferred     OS:  No view     Peripheral retina--examined with indirect ophthalmoscope with lid speculum and scleral depression:   OD:  deferred  OS:  No view     Impression:  Severe left upper lid pre-septal cellulitis with prior abscess  drainage on Unasyn and Vancomycin IV antibiotics Severe proliferative diabetic retinopathy with vision loss left eye    Recommendations/Plan:  Continue IV antibiotics and reevaluate after 24 hours of antibiotics.  Consider I&D if continued or worsened induration.     I've discussed these findings with the nurse and/or resident. Please contact our office with any questions or concerns at [phone]. Thank you for calling us to care for this nice lady.  Royston Cowper

## 2016-01-08 NOTE — Progress Notes (Signed)
Inpatient Diabetes Program Recommendations  AACE/ADA: New Consensus Statement on Inpatient Glycemic Control (2015)  Target Ranges:  Prepandial:   less than 140 mg/dL      Peak postprandial:   less than 180 mg/dL (1-2 hours)      Critically ill patients:  140 - 180 mg/dL   Review of Glycemic Control:  Results for [name], [name] (MRN [id number]) as of 01/08/2016 10:44  Ref. Range 01/07/2016 21:22 01/08/2016 00:44 01/08/2016 04:21 01/08/2016 07:51  Glucose-Capillary Latest Ref Range: 65-99 mg/dL 258 (H) 216 (H) 143 (H) 151 (H)   Diabetes history: Type 2 diabetes Outpatient Diabetes medications: Tresiba 50 units q HS, Januvia 100 mg daily Current orders for Inpatient glycemic control:  Novolog sensitive q 4 hours  Inpatient Diabetes Program Recommendations:    Please add Lantus 25 units daily to meet basal insulin needs. Also please check A1C to determine pre-hospitalization glycemic control.   Thanks, Adah Perl, RN, BC-ADM Inpatient Diabetes Coordinator Pager [phone] (8a-5p)

## 2016-01-08 NOTE — Care Management Note (Signed)
Case Management Note  Patient Details  Name: Brooke Hudson MRN: [id number] Date of Birth: [birth year]  Subjective/Objective:                    Action/Plan:   Expected Discharge Date:                  Expected Discharge Plan:  Home/Self Care  In-House Referral:     Discharge planning Services     Post Acute Care Choice:    Choice offered to:     DME Arranged:    DME Agency:     HH Arranged:    [place] Agency:     Status of Service:  In process, will continue to follow  Medicare Important Message Given:    Date Medicare IM Given:    Medicare IM give by:    Date Additional Medicare IM Given:    Additional Medicare Important Message give by:     If discussed at [place] of Stay Meetings, dates discussed:    Additional Comments:  Marilu Favre, RN 01/08/2016, 11:29 AM

## 2016-01-09 LAB — BASIC METABOLIC PANEL
Anion gap: 13 (ref 5–15)
BUN: 7 mg/dL (ref 6–20)
CALCIUM: 9 mg/dL (ref 8.9–10.3)
CHLORIDE: 103 mmol/L (ref 101–111)
CO2: 26 mmol/L (ref 22–32)
CREATININE: 0.71 mg/dL (ref 0.44–1.00)
GFR calc non Af Amer: >60 mL/min (ref >60)
Glucose, Bld: 107 mg/dL — ABNORMAL HIGH (ref 65–99)
Potassium: 4 mmol/L (ref 3.5–5.1)
Sodium: 142 mmol/L (ref 135–145)

## 2016-01-09 LAB — HEMOGLOBIN A1C
Hgb A1c MFr Bld: 8.7 % — ABNORMAL HIGH (ref 4.8–5.6)
Mean Plasma Glucose: 203 mg/dL

## 2016-01-09 LAB — GLUCOSE, CAPILLARY
GLUCOSE-CAPILLARY: 110 mg/dL — AB (ref 65–99)
GLUCOSE-CAPILLARY: 130 mg/dL — AB (ref 65–99)
Glucose-Capillary: 103 mg/dL — ABNORMAL HIGH (ref 65–99)
Glucose-Capillary: 148 mg/dL — ABNORMAL HIGH (ref 65–99)
Glucose-Capillary: 156 mg/dL — ABNORMAL HIGH (ref 65–99)
Glucose-Capillary: 187 mg/dL — ABNORMAL HIGH (ref 65–99)

## 2016-01-09 LAB — CBC
HCT: 33.3 % — ABNORMAL LOW (ref 36.0–46.0)
Hemoglobin: 11.2 g/dL — ABNORMAL LOW (ref 12.0–15.0)
MCH: 32 pg (ref 26.0–34.0)
MCHC: 33.6 g/dL (ref 30.0–36.0)
MCV: 95.1 fL (ref 78.0–100.0)
PLATELETS: 185 10*3/uL (ref 150–400)
RBC: 3.5 MIL/uL — ABNORMAL LOW (ref 3.87–5.11)
RDW: 13.6 % (ref 11.5–15.5)
WBC: 5.1 10*3/uL (ref 4.0–10.5)

## 2016-01-09 MED ORDER — INSULIN ASPART 100 UNIT/ML ~~LOC~~ SOLN
0.0000 [IU] | Freq: Three times a day (TID) | SUBCUTANEOUS | Status: DC
  Administered 2016-01-10: 2 [IU] via SUBCUTANEOUS
  Administered 2016-01-10: 5 [IU] via SUBCUTANEOUS

## 2016-01-09 NOTE — Progress Notes (Signed)
Patient ID: Brooke Hudson, female   DOB: Jul 29, 1951, 65 y.o.   MRN: [id number]    PROGRESS NOTE    Brooke Hudson  [id number] DOB: [birth year] DOA: 01/07/2016  PCP: Charline Bills, MD   Brief Narrative:  65 y.o. female with DM, stroke in 2010 with no residual deficits, HTN, CAD with coronary stent placed 15 yrs ago and no specific problems since that time, MRSA abscess, presented to [facility] ED with main concern of 4-5 days duration of progressively worsening left eye swelling, associated with fevers, chills, pain and itching. She apparently had CT of the eye done and was notable for an abscess (not uploaded in the system yet). Pt was started on vancomycin and zosyn and ophthalmology consulted. TRH asked to admit for further evaluation.   Assessment & Plan:   Active Problems:   Orbital cellulitis, left - severe with significant surrounding erythema and edema, improving, less TTP  - continue vancomycin and zosyn day #3 - follow up on blood cultures which are still pending  - emphasized importance of glucose control - ophthalmologist on call to review images from this AM to decide if intervention needed  - keep NPO for now     Essential hypertension - continue Norvasc as per home medical regimen     DM type 2 causing eye disease, not at goal [facility]) - continue SSI for now     CAD (coronary artery disease), native coronary artery - asymptomatic   DVT prophylaxis: SCD's Code Status: Full  Family Communication: Patient at bedside  Disposition Plan: Home once ophthalmology team clears   Consultants:   Ophthalmology    Procedures:   None  Antimicrobials:   Vancomycin 5/10 -->  Zosyn 5/10 -->  Subjective: Pt reports persistent TTP but overall better.   Objective: Filed Vitals:   01/07/16 2347 01/08/16 1500 01/08/16 2037 01/09/16 0456  BP: 162/50 151/50 132/56 140/55  Pulse: 78 71 74 72  Temp: 98.9 F (37.2 C) 98.5 F (36.9 C) 98.3 F (36.8 C) 98.2 F (36.8 C)    TempSrc: Oral Oral Oral Oral  Resp: 19 18 17 18   Height:      Weight:      SpO2: 92% 92% 93% 93%    Intake/Output Summary (Last 24 hours) at 01/09/16 1007 Last data filed at 01/09/16 0847  Gross per 24 hour  Intake    996 ml  Output   2251 ml  Net  -1255 ml   Filed Weights   01/07/16 1826  Weight: 74.844 kg (165 lb)    Examination:  General exam: Appears calm and comfortable  Respiratory system: Clear to auscultation. Respiratory effort normal. Cardiovascular system: S1 & S2 heard, RRR. No JVD, rubs, gallops or clicks. No pedal edema. Gastrointestinal system: Abdomen is nondistended, soft and nontender. No organomegaly or masses felt. Normal bowel sounds heard. Eyes: left orbital cellulitis, no drainage, more crusting in the central area, less erythema and less TTP  Psychiatry: Judgement and insight appear normal. Mood & affect appropriate.   Data Reviewed: I have personally reviewed following labs and imaging studies  CBC:  Recent Labs Lab 01/08/16 0104 01/08/16 0319 01/08/16 0834 01/08/16 1322 01/09/16 0709  WBC 6.1 5.9 6.2 6.0 5.1  HGB 10.4* 10.0* 11.0* 10.8* 11.2*  HCT 31.1* 30.6* 33.5* 33.0* 33.3*  MCV 94.5 93.6 94.9 95.7 95.1  PLT 166 162 184 180 [id number]   Basic Metabolic Panel:  Recent Labs Lab 01/07/16 2305 01/08/16 0319 01/08/16 0834 01/08/16 1123 01/09/16 [id number]  NA 137 140 139 140 142  K 3.8 3.7 4.0 4.2 4.0  CL 100* 102 101 101 103  CO2 27 30 27 28 26   GLUCOSE 229* 154* 162* 254* 107*  BUN 9 8 8 7 7   CREATININE 0.72 0.68 0.69 0.63 0.71  CALCIUM 8.9 8.9 9.0 8.9 9.0  MG  --  2.1  --   --   --   PHOS  --  2.8  --   --   --    Liver Function Tests:  Recent Labs Lab 01/08/16 0319  AST 11*  ALT 11*  ALKPHOS 79  BILITOT 0.4  PROT 6.5  ALBUMIN 2.4*   Coagulation Profile:  Recent Labs Lab 01/07/16 1920  INR 1.01   CBG:  Recent Labs Lab 01/08/16 1632 01/08/16 2021 01/09/16 0016 01/09/16 0411 01/09/16 0752  GLUCAP 212* 217* 148*  103* 110*   Thyroid Function Tests:  Recent Labs  01/08/16 0319  TSH 3.151   Recent Results (from the past 240 hour(s))  MRSA PCR Screening     Status: Abnormal   Collection Time: 01/07/16  6:45 PM  Result Value Ref Range Status   MRSA by PCR POSITIVE (A) NEGATIVE Final  Culture, blood (Routine X 2) w Reflex to ID Panel     Status: None (Preliminary result)   Collection Time: 01/07/16  9:15 PM  Result Value Ref Range Status   Specimen Description BLOOD RIGHT ANTECUBITAL  Final   Culture PENDING  Incomplete   Report Status PENDING  Incomplete     Radiology Studies: No results found.  Scheduled Meds: . amLODipine  5 mg Oral Daily  . Chlorhexidine Gluconate Cloth  6 each Topical Q0600  . erythromycin   Left Eye Q8H  . insulin aspart  0-9 Units Subcutaneous Q4H  . mupirocin ointment  1 application Nasal BID  . piperacillin-tazobactam (ZOSYN)  IV  3.375 g Intravenous Q8H  . vancomycin  750 mg Intravenous Q12H     LOS: 2 days   Time spent: 20 minutes   Faye Ramsay, MD [facility] Pager [phone]  If 7PM-7AM, please contact night-coverage [email or web address] Password [email or web address] 01/09/2016, 10:07 AM

## 2016-01-10 LAB — GLUCOSE, CAPILLARY
GLUCOSE-CAPILLARY: 280 mg/dL — AB (ref 65–99)
Glucose-Capillary: 163 mg/dL — ABNORMAL HIGH (ref 65–99)

## 2016-01-10 MED ORDER — HYDROCODONE-ACETAMINOPHEN 5-325 MG PO TABS: 1.0000 | ORAL_TABLET | ORAL | Status: DC | PRN

## 2016-01-10 MED ORDER — INSULIN DEGLUDEC 100 UNIT/ML ~~LOC~~ SOPN: 25.0000 [IU] | PEN_INJECTOR | Freq: Every day | SUBCUTANEOUS | Status: DC

## 2016-01-10 MED ORDER — CLINDAMYCIN HCL 300 MG PO CAPS: 300.0000 mg | ORAL_CAPSULE | Freq: Three times a day (TID) | ORAL | Status: DC

## 2016-01-10 NOTE — Discharge Instructions (Signed)
Please note that dose of Insulin lantus was lowered to 25 U due to lower blood sugar levels, if you r sugar level is > 250, please call your provided and ask for further instructions on insulin dose as you may need to have the dose increased   Complete therapy with clindamycin antibiotic for eye cellulitis (10 days), please see more detailed instructions on Clindamycin below, report any intolerance or side effects to your provider     Orbital Cellulitis Orbital cellulitis is an infection in the eye socket (orbit) and the tissues that surround the eye. The infection can spread to the eyelids, eyebrow area, and cheek. It can also cause a pocket of pus to develop around the eye (orbital abscess). In severe cases, the infection can spread to the brain. Orbital cellulitis is a medical emergency. CAUSES The most common cause of this condition is a bacterial infection. The infection usually spreads to the eye socket from another part of the body. The infection may start in:  The nose or sinuses.  The eyelids.  Facial skin.  The bloodstream. RISK FACTORS This condition is more likely to develop in people who have recently had one of the following:  Upper respiratory infection.  Sinus infection.  Eyelid or facial infection.  Eye injury.  Infection that affects the entire body or the bloodstream (systemic infection). SYMPTOMS Symptoms of this condition usually start quickly. Symptoms include:  Eye pain that gets worse with eye movement.  Swelling around the eye.  Eye redness.  Bulging of the eye.  Inability to move the eye.  Double vision.  Fever. DIAGNOSIS This condition may be diagnosed based on your symptoms and an eye exam. You may also have tests to confirm the diagnosis and to check for an orbital abscess. Other tests (cultures) may be done to find out what type of bacteria is causing the infection. Tests may include:  Complete blood count (CBC).  Blood  culture.  Nose, sinus, or throat culture.  Imaging studies such as a CT scan or MRI. TREATMENT This condition is usually treated in a hospital. Antibiotic medicines are given directly into a vein through an IV tube.  At first, you may get IV antibiotics to kill bacteria that often cause orbital cellulitis (broad spectrum antibiotics).  Your medicine may be changed if cultures suggest that another antibiotic would be better.  If the IV antibiotics are working to treat your infection, you may be switched to oral antibiotics and allowed to go home.  In some cases, surgery may be needed to drain an orbital abscess. HOME CARE INSTRUCTIONS  Take medicines only as directed by your health care provider.  Take your antibiotic medicine as directed by your health care provider. Finish the antibiotic even if you start to feel better.  Return to your normal activities as directed by your health care provider. Ask your health care provider what activities are safe for you.  Keep all follow-up visits as directed by your health care provider. This is important. SEEK IMMEDIATE MEDICAL CARE IF:  Your eye pain or swelling returns or it gets worse.  You have any changes in your vision.  You have a fever.   This information is not intended to replace advice given to you by your health care provider. Make sure you discuss any questions you have with your health care provider.   Document Released: 08/10/2001 Document Revised: 12/31/2014 Document Reviewed: 08/12/2014 Elsevier Interactive Patient Education 2016 Elsevier Inc.  Clindamycin capsules What is  this medicine? CLINDAMYCIN (Felsenthal sin) is a lincosamide antibiotic. It is used to treat certain kinds of bacterial infections. It will not work for colds, flu, or other viral infections. This medicine may be used for other purposes; ask your health care provider or pharmacist if you have questions. What should I tell my health care provider  before I take this medicine? They need to know if you have any of these conditions: -kidney disease -liver disease -stomach problems like colitis -an unusual or allergic reaction to clindamycin, lincomycin, or other medicines, foods, dyes like tartrazine or preservatives -pregnant or trying to get pregnant -breast-feeding How should I use this medicine? Take this medicine by mouth with a full glass of water. Follow the directions on the prescription label. You can take this medicine with food or on an empty stomach. If the medicine upsets your stomach, take it with food. Take your medicine at regular intervals. Do not take your medicine more often than directed. Take all of your medicine as directed even if you think your are better. Do not skip doses or stop your medicine early. Talk to your pediatrician regarding the use of this medicine in children. Special care may be needed. Overdosage: If you think you have taken too much of this medicine contact a poison control center or emergency room at once. NOTE: This medicine is only for you. Do not share this medicine with others. What if I miss a dose? If you miss a dose, take it as soon as you can. If it is almost time for your next dose, take only that dose. Do not take double or extra doses. What may interact with this medicine? -birth control pills -chloramphenicol -erythromycin -kaolin products This list may not describe all possible interactions. Give your health care provider a list of all the medicines, herbs, non-prescription drugs, or dietary supplements you use. Also tell them if you smoke, drink alcohol, or use illegal drugs. Some items may interact with your medicine. What should I watch for while using this medicine? Tell your doctor or healthcare professional if your symptoms do not start to get better or if they get worse. Do not treat diarrhea with over the counter products. Contact your doctor if you have diarrhea that lasts  more than 2 days or if it is severe and watery. What side effects may I notice from receiving this medicine? Side effects that you should report to your doctor or health care professional as soon as possible: -allergic reactions like skin rash, itching or hives, swelling of the face, lips, or tongue -dark urine -pain on swallowing -redness, blistering, peeling or loosening of the skin, including inside the mouth -unusual bleeding or bruising -unusually weak or tired -yellowing of eyes or skin Side effects that usually do not require medical attention (report to your doctor or health care professional if they continue or are bothersome): -diarrhea -itching in the rectal or genital area -joint pain -nausea, vomiting -stomach pain This list may not describe all possible side effects. Call your doctor for medical advice about side effects. You may report side effects to FDA at 1-800-FDA-1088. Where should I keep my medicine? Keep out of the reach of children. Store at room temperature between 20 and 25 degrees C (68 and 77 degrees F). Throw away any unused medicine after the expiration date. NOTE: This sheet is a summary. It may not cover all possible information. If you have questions about this medicine, talk to your doctor, pharmacist, or  health care provider.    2016, Elsevier/Gold Standard. (2013-03-22 16:12:32)

## 2016-01-10 NOTE — Progress Notes (Signed)
D/c home with daughter. VSS pain 7/10 medication given.  Instructions and rx's given and explained with understanding demonstrated.

## 2016-01-10 NOTE — Progress Notes (Signed)
Pharmacy Antibiotic Note  Brooke Hudson is a 65 y.o. female admitted on 01/07/2016 with cellulitis.  Pharmacy has been consulted for vancomycin and zosyn dosing. Pt presents with eye swelling.  Day #4 of abx for orbital cellulitis. Per MD seems to be improving. Opthalmology consulted and deciding on if further treatment is nee ded or just to use course of abx. Afebrile, WBC wnl.  Plan: Continue Zosyn 3.375 gm IV q8h (4 hour infusion) Continue vancomycin 750mg  IV Q12 Monitor clinical picture, renal function, VT prn F/U C&S, abx deescalation / LOT  Possible discharge home today, could transition to PO regimen  Height: 5\' 2"  (157.5 cm) Weight: 165 lb (74.844 kg) IBW/kg (Calculated) : 50.1  Temp (24hrs), Avg:98.6 F (37 C), Min:98.2 F (36.8 C), Max:99.3 F (37.4 C)   Recent Labs Lab 01/07/16 2305 01/08/16 0104 01/08/16 0319 01/08/16 0834 01/08/16 1123 01/08/16 1322 01/09/16 0709  WBC  --  6.1 5.9 6.2  --  6.0 5.1  CREATININE 0.72  --  0.68 0.69 0.63  --  0.71    Estimated Creatinine Clearance: 65.5 mL/min (by C-G formula based on Cr of 0.71).    Allergies  Allergen Reactions  . Bactrim [Sulfamethoxazole-Trimethoprim] Other (See Comments)    Dehydrates and disorients/ delusions  . Benzocaine Rash    Antimicrobials this admission: Vanc 5/10 >>  Zosyn 5/10 >>   Dose adjustments this admission: n/a  Microbiology results: Blood cx 5/10 > ngtd MRSA PCR positive  Elenor Quinones, PharmD, [facility] Clinical Pharmacist Pager [phone] 01/10/2016 10:30 AM

## 2016-01-10 NOTE — Discharge Summary (Addendum)
Physician Discharge Summary  Brooke Hudson [id number] DOB: 21-Nov-1950 DOA: 01/07/2016  PCP: Charline Bills, MD  Admit date: 01/07/2016 Discharge date: 01/10/2016  Recommendations for Outpatient Follow-up:  1. Pt will need to follow up with PCP in 1-2 weeks post discharge 2. Please obtain BMP to evaluate electrolytes and kidney function 3. Complete therapy with Clindamycin and must see opthalmology for follow up  4. Pt made aware of the need for close follow up   Discharge Diagnoses:  Active Problems:   Orbital cellulitis   Essential hypertension   DM type 2 causing eye disease, not at goal [facility])   CAD (coronary artery disease), native coronary artery   Discharge Condition: Stable  Diet recommendation: Heart healthy diet discussed in details    Brief Narrative:  65 y.o. female with DM, stroke in 2010 with no residual deficits, HTN, CAD with coronary stent placed 15 yrs ago and no specific problems since that time, MRSA abscess, presented to [facility] ED with main concern of 4-5 days duration of progressively worsening left eye swelling, associated with fevers, chills, pain and itching. She apparently had CT of the eye done and was notable for an abscess (not uploaded in the system yet). Pt was started on vancomycin and zosyn and ophthalmology consulted. TRH asked to admit for further evaluation.   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 - erythema and edema, improving, less TTP  - continued vancomycin and zosyn day #4 and transitioned to oral Clindamycin to complete therapy upon discharge  - emphasized importance of glucose control - ophthalmologist follow up emphasized as well as compliance with ABX    Essential hypertension - continue Norvasc as per home medical regimen    DM type 2 causing eye  disease, not at goal [facility]) - continue home regimen    CAD (coronary artery disease), native coronary artery - asymptomatic   DVT prophylaxis: SCD's Code Status: Full  Family Communication: Patient at bedside  Disposition Plan: Home once ophthalmology team clears   Consultants:   Ophthalmology  Procedures:   None  Antimicrobials:   Vancomycin 5/10 -->  Zosyn 5/10 -->  Both ABX stopped on discharge and changed to oral Clindamycin   Discharge Exam: Filed Vitals:   01/09/16 2056 01/10/16 0459  BP: 132/70 159/66  Pulse: 68 63  Temp: 99.3 F (37.4 C) 98.2 F (36.8 C)  Resp: 19 19   Filed Vitals:   01/09/16 0456 01/09/16 1422 01/09/16 2056 01/10/16 0459  BP: 140/55 150/60 132/70 159/66  Pulse: 72 62 68 63  Temp: 98.2 F (36.8 C) 98.4 F (36.9 C) 99.3 F (37.4 C) 98.2 F (36.8 C)  TempSrc: Oral Oral Oral Oral  Resp: 18 18 19 19   Height:      Weight:      SpO2: 93% 95% 92% 98%    General: Pt is alert, follows commands appropriately, not in acute distress, L orbital cellulitis improving, less edema and erythema  Cardiovascular: Regular rate and rhythm, no rubs, no gallops Respiratory: Clear to auscultation bilaterally, no wheezing, no crackles, no rhonchi Abdominal: Soft, non tender, non distended, bowel sounds +, no guarding   Discharge Instructions  Discharge Instructions    Diet - low sodium heart healthy    Complete by:  As directed      Increase activity slowly    Complete by:  As directed             Medication List    TAKE these medications        albuterol 108 (90 Base) MCG/ACT inhaler  Commonly known as:  PROVENTIL HFA;VENTOLIN HFA  Inhale 1 puff into the lungs every 6 (six) hours as needed for wheezing or shortness of breath.     amLODipine 5 MG tablet  Commonly known as:  NORVASC  Take 5 mg by mouth daily.     aspirin EC 81 MG tablet  Take 81 mg by mouth daily.     atenolol 50 MG tablet  Commonly known as:  TENORMIN  Take 50  mg by mouth daily.     cilostazol 100 MG tablet  Commonly known as:  PLETAL  Take 100 mg by mouth 2 (two) times daily.     clindamycin 300 MG capsule  Commonly known as:  CLEOCIN  Take 1 capsule (300 mg total) by mouth 3 (three) times daily.     clopidogrel 75 MG tablet  Commonly known as:  PLAVIX  Take 75 mg by mouth daily.     erythromycin ophthalmic ointment  Commonly known as:  ROMYCIN  Place into the left eye 3 (three) times daily. Place a 1/2 inch ribbon of ointment onto upper eyelid over erythematous and open areas     furosemide 20 MG tablet  Commonly known as:  LASIX  Take 20 mg by mouth daily.     HYDROcodone-acetaminophen 5-325 MG tablet  Commonly known as:  NORCO/VICODIN  Take 1-2 tablets by mouth every 4 (four) hours as needed for moderate pain.     Insulin Degludec 100 UNIT/ML Sopn  Commonly known as:  TRESIBA FLEXTOUCH  Inject 25 Units into the skin at bedtime.     lubiprostone 8 MCG capsule  Commonly known as:  AMITIZA  Take 8 mcg by mouth 2 (two) times daily with a meal.     pantoprazole 40 MG tablet  Commonly known as:  PROTONIX  Take 40 mg by mouth daily.     rosuvastatin 20 MG tablet  Commonly known as:  CRESTOR  Take 20 mg by mouth daily.     sitaGLIPtin 100 MG tablet  Commonly known as:  JANUVIA  Take 100 mg by mouth daily.            Follow-up Information    Follow up with HAYES,ANTHONY, MD.   Specialty:  Family Medicine   Contact information:   [address] [place] [address] [place] [address] [phone]       Schedule an appointment as soon as possible for a visit with Royston Cowper, MD.   Specialty:  Ophthalmology   Contact information:   [address] [place] [address] [phone]       Call Faye Ramsay, MD.   Specialty:  Internal Medicine   Why:  As needed call my cell phone [phone]   Contact information:   [address] [place]  [address] [phone]        The results of significant diagnostics from this hospitalization (including imaging, microbiology, ancillary and laboratory) are listed below for reference.     Microbiology: Recent Results (from the past 240 hour(s))  MRSA PCR Screening     Status: Abnormal   Collection Time: 01/07/16  6:45 PM  Result Value Ref Range Status   MRSA by PCR POSITIVE (A) NEGATIVE Final    Comment:  The GeneXpert MRSA Assay (FDA approved for NASAL specimens only), is one component of a comprehensive MRSA colonization surveillance program. It is not intended to diagnose MRSA infection nor to guide or monitor treatment for MRSA infections. RESULT CALLED TO, READ BACK BY AND VERIFIED WITH: C.SISON,RN AT 2254 BY L.PITT 01/07/16   Culture, blood (Routine X 2) w Reflex to ID Panel     Status: None (Preliminary result)   Collection Time: 01/07/16  9:00 PM  Result Value Ref Range Status   Specimen Description BLOOD RIGHT HAND  Final   Special Requests IN PEDIATRIC BOTTLE 3CC  Final   Culture NO GROWTH 2 DAYS  Final   Report Status PENDING  Incomplete  Culture, blood (Routine X 2) w Reflex to ID Panel     Status: None (Preliminary result)   Collection Time: 01/07/16  9:15 PM  Result Value Ref Range Status   Specimen Description BLOOD RIGHT ANTECUBITAL  Final   Special Requests BOTTLES DRAWN AEROBIC ONLY 10CC  Final   Culture NO GROWTH 2 DAYS  Final   Report Status PENDING  Incomplete     Labs: Basic Metabolic Panel:  Recent Labs Lab 01/07/16 2305 01/08/16 0319 01/08/16 0834 01/08/16 1123 01/09/16 0709  NA 137 140 139 140 142  K 3.8 3.7 4.0 4.2 4.0  CL 100* 102 101 101 103  CO2 27 30 27 28 26   GLUCOSE 229* 154* 162* 254* 107*  BUN 9 8 8 7 7   CREATININE 0.72 0.68 0.69 0.63 0.71  CALCIUM 8.9 8.9 9.0 8.9 9.0  MG  --  2.1  --   --   --   PHOS  --  2.8  --   --   --    Liver Function Tests:  Recent Labs Lab 01/08/16 0319  AST 11*  ALT 11*  ALKPHOS  79  BILITOT 0.4  PROT 6.5  ALBUMIN 2.4*   CBC:  Recent Labs Lab 01/08/16 0104 01/08/16 0319 01/08/16 0834 01/08/16 1322 01/09/16 0709  WBC 6.1 5.9 6.2 6.0 5.1  HGB 10.4* 10.0* 11.0* 10.8* 11.2*  HCT 31.1* 30.6* 33.5* 33.0* 33.3*  MCV 94.5 93.6 94.9 95.7 95.1  PLT 166 162 184 180 185   CBG:  Recent Labs Lab 01/09/16 0752 01/09/16 1140 01/09/16 1607 01/09/16 2055 01/10/16 0735  GLUCAP 110* 130* 156* 187* 163*   SIGNED: Time coordinating discharge: 30 minutes  MAGICK-MYERS, ISKRA, MD  [facility] 01/10/2016, 9:02 AM Pager [phone]  If 7PM-7AM, please contact night-coverage [email or web address] Password [email or web address]

## 2016-01-13 LAB — CULTURE, BLOOD (ROUTINE X 2)
CULTURE: NO GROWTH
Culture: NO GROWTH

## 2016-10-28 DIAGNOSIS — E7800 Pure hypercholesterolemia, unspecified: Secondary | ICD-10-CM | POA: Diagnosis not present

## 2016-10-28 DIAGNOSIS — I10 Essential (primary) hypertension: Secondary | ICD-10-CM | POA: Diagnosis not present

## 2016-10-28 DIAGNOSIS — I739 Peripheral vascular disease, unspecified: Secondary | ICD-10-CM | POA: Diagnosis not present

## 2016-10-28 DIAGNOSIS — R0602 Shortness of breath: Secondary | ICD-10-CM | POA: Diagnosis not present

## 2016-10-28 DIAGNOSIS — I2510 Atherosclerotic heart disease of native coronary artery without angina pectoris: Secondary | ICD-10-CM | POA: Diagnosis not present

## 2016-10-31 DIAGNOSIS — R0902 Hypoxemia: Secondary | ICD-10-CM | POA: Diagnosis not present

## 2016-11-15 DIAGNOSIS — I080 Rheumatic disorders of both mitral and aortic valves: Secondary | ICD-10-CM | POA: Diagnosis not present

## 2016-11-15 DIAGNOSIS — I517 Cardiomegaly: Secondary | ICD-10-CM | POA: Diagnosis not present

## 2016-11-23 DIAGNOSIS — R7989 Other specified abnormal findings of blood chemistry: Secondary | ICD-10-CM | POA: Diagnosis not present

## 2016-11-23 DIAGNOSIS — R7981 Abnormal blood-gas level: Secondary | ICD-10-CM | POA: Diagnosis not present

## 2016-11-23 DIAGNOSIS — Z0000 Encounter for general adult medical examination without abnormal findings: Secondary | ICD-10-CM | POA: Diagnosis not present

## 2016-11-23 DIAGNOSIS — Z6826 Body mass index (BMI) 26.0-26.9, adult: Secondary | ICD-10-CM | POA: Diagnosis not present

## 2016-11-23 DIAGNOSIS — R946 Abnormal results of thyroid function studies: Secondary | ICD-10-CM | POA: Diagnosis not present

## 2016-11-23 DIAGNOSIS — E663 Overweight: Secondary | ICD-10-CM | POA: Diagnosis not present

## 2016-12-01 DIAGNOSIS — R0902 Hypoxemia: Secondary | ICD-10-CM | POA: Diagnosis not present

## 2016-12-30 DIAGNOSIS — S0993XA Unspecified injury of face, initial encounter: Secondary | ICD-10-CM | POA: Diagnosis not present

## 2016-12-30 DIAGNOSIS — R51 Headache: Secondary | ICD-10-CM | POA: Diagnosis not present

## 2016-12-30 DIAGNOSIS — S0990XA Unspecified injury of head, initial encounter: Secondary | ICD-10-CM | POA: Diagnosis not present

## 2016-12-30 DIAGNOSIS — H5712 Ocular pain, left eye: Secondary | ICD-10-CM | POA: Diagnosis not present

## 2016-12-31 DIAGNOSIS — S0993XA Unspecified injury of face, initial encounter: Secondary | ICD-10-CM | POA: Diagnosis not present

## 2016-12-31 DIAGNOSIS — S0990XA Unspecified injury of head, initial encounter: Secondary | ICD-10-CM | POA: Diagnosis not present

## 2016-12-31 DIAGNOSIS — R51 Headache: Secondary | ICD-10-CM | POA: Diagnosis not present

## 2016-12-31 DIAGNOSIS — H5712 Ocular pain, left eye: Secondary | ICD-10-CM | POA: Diagnosis not present

## 2016-12-31 DIAGNOSIS — R0902 Hypoxemia: Secondary | ICD-10-CM | POA: Diagnosis not present

## 2017-01-31 DIAGNOSIS — R0902 Hypoxemia: Secondary | ICD-10-CM | POA: Diagnosis not present

## 2017-03-02 DIAGNOSIS — R0902 Hypoxemia: Secondary | ICD-10-CM | POA: Diagnosis not present

## 2017-03-15 DIAGNOSIS — R946 Abnormal results of thyroid function studies: Secondary | ICD-10-CM | POA: Diagnosis not present

## 2017-03-15 DIAGNOSIS — I2510 Atherosclerotic heart disease of native coronary artery without angina pectoris: Secondary | ICD-10-CM | POA: Diagnosis not present

## 2017-03-15 DIAGNOSIS — Z79899 Other long term (current) drug therapy: Secondary | ICD-10-CM | POA: Diagnosis not present

## 2017-03-15 DIAGNOSIS — E782 Mixed hyperlipidemia: Secondary | ICD-10-CM | POA: Diagnosis not present

## 2017-03-15 DIAGNOSIS — H543 Unqualified visual loss, both eyes: Secondary | ICD-10-CM | POA: Diagnosis not present

## 2017-03-15 DIAGNOSIS — E1140 Type 2 diabetes mellitus with diabetic neuropathy, unspecified: Secondary | ICD-10-CM | POA: Diagnosis not present

## 2017-03-15 DIAGNOSIS — F17200 Nicotine dependence, unspecified, uncomplicated: Secondary | ICD-10-CM | POA: Diagnosis not present

## 2017-03-15 DIAGNOSIS — I10 Essential (primary) hypertension: Secondary | ICD-10-CM | POA: Diagnosis not present

## 2017-03-15 DIAGNOSIS — K219 Gastro-esophageal reflux disease without esophagitis: Secondary | ICD-10-CM | POA: Diagnosis not present

## 2017-04-02 DIAGNOSIS — R0902 Hypoxemia: Secondary | ICD-10-CM | POA: Diagnosis not present

## 2017-04-15 DIAGNOSIS — R0602 Shortness of breath: Secondary | ICD-10-CM | POA: Diagnosis not present

## 2017-04-15 DIAGNOSIS — J209 Acute bronchitis, unspecified: Secondary | ICD-10-CM | POA: Diagnosis not present

## 2017-04-15 DIAGNOSIS — I10 Essential (primary) hypertension: Secondary | ICD-10-CM | POA: Diagnosis not present

## 2017-04-15 DIAGNOSIS — Z8673 Personal history of transient ischemic attack (TIA), and cerebral infarction without residual deficits: Secondary | ICD-10-CM | POA: Diagnosis not present

## 2017-04-15 DIAGNOSIS — E119 Type 2 diabetes mellitus without complications: Secondary | ICD-10-CM | POA: Diagnosis not present

## 2017-04-15 DIAGNOSIS — Z79899 Other long term (current) drug therapy: Secondary | ICD-10-CM | POA: Diagnosis not present

## 2017-04-15 DIAGNOSIS — F17210 Nicotine dependence, cigarettes, uncomplicated: Secondary | ICD-10-CM | POA: Diagnosis not present

## 2017-04-15 DIAGNOSIS — R05 Cough: Secondary | ICD-10-CM | POA: Diagnosis not present

## 2017-04-15 DIAGNOSIS — I252 Old myocardial infarction: Secondary | ICD-10-CM | POA: Diagnosis not present

## 2017-04-16 DIAGNOSIS — R0602 Shortness of breath: Secondary | ICD-10-CM | POA: Diagnosis not present

## 2017-04-16 DIAGNOSIS — R05 Cough: Secondary | ICD-10-CM | POA: Diagnosis not present

## 2017-04-21 DIAGNOSIS — N390 Urinary tract infection, site not specified: Secondary | ICD-10-CM | POA: Diagnosis not present

## 2017-04-21 DIAGNOSIS — R404 Transient alteration of awareness: Secondary | ICD-10-CM | POA: Diagnosis not present

## 2017-04-21 DIAGNOSIS — R531 Weakness: Secondary | ICD-10-CM | POA: Diagnosis not present

## 2017-04-21 DIAGNOSIS — E860 Dehydration: Secondary | ICD-10-CM | POA: Diagnosis not present

## 2017-04-21 DIAGNOSIS — R0602 Shortness of breath: Secondary | ICD-10-CM | POA: Diagnosis not present

## 2017-04-21 DIAGNOSIS — R4781 Slurred speech: Secondary | ICD-10-CM | POA: Diagnosis not present

## 2017-05-03 DIAGNOSIS — R0902 Hypoxemia: Secondary | ICD-10-CM | POA: Diagnosis not present

## 2017-05-10 DIAGNOSIS — E1140 Type 2 diabetes mellitus with diabetic neuropathy, unspecified: Secondary | ICD-10-CM | POA: Diagnosis not present

## 2017-06-02 DIAGNOSIS — R0902 Hypoxemia: Secondary | ICD-10-CM | POA: Diagnosis not present

## 2017-06-29 DIAGNOSIS — E1140 Type 2 diabetes mellitus with diabetic neuropathy, unspecified: Secondary | ICD-10-CM | POA: Diagnosis not present

## 2017-06-29 DIAGNOSIS — R140 Abdominal distension (gaseous): Secondary | ICD-10-CM | POA: Diagnosis not present

## 2017-06-29 DIAGNOSIS — Z23 Encounter for immunization: Secondary | ICD-10-CM | POA: Diagnosis not present

## 2017-06-29 DIAGNOSIS — E782 Mixed hyperlipidemia: Secondary | ICD-10-CM | POA: Diagnosis not present

## 2017-06-29 DIAGNOSIS — Z79899 Other long term (current) drug therapy: Secondary | ICD-10-CM | POA: Diagnosis not present

## 2017-06-29 DIAGNOSIS — I10 Essential (primary) hypertension: Secondary | ICD-10-CM | POA: Diagnosis not present

## 2017-06-29 DIAGNOSIS — E11319 Type 2 diabetes mellitus with unspecified diabetic retinopathy without macular edema: Secondary | ICD-10-CM | POA: Diagnosis not present

## 2017-06-29 DIAGNOSIS — E039 Hypothyroidism, unspecified: Secondary | ICD-10-CM | POA: Diagnosis not present

## 2017-07-03 DIAGNOSIS — R0902 Hypoxemia: Secondary | ICD-10-CM | POA: Diagnosis not present

## 2017-07-05 DIAGNOSIS — R8290 Unspecified abnormal findings in urine: Secondary | ICD-10-CM | POA: Diagnosis not present

## 2017-08-02 DIAGNOSIS — R0902 Hypoxemia: Secondary | ICD-10-CM | POA: Diagnosis not present

## 2017-08-28 DIAGNOSIS — E871 Hypo-osmolality and hyponatremia: Secondary | ICD-10-CM | POA: Diagnosis not present

## 2017-08-28 DIAGNOSIS — R29818 Other symptoms and signs involving the nervous system: Secondary | ICD-10-CM | POA: Diagnosis not present

## 2017-08-28 DIAGNOSIS — S6990XA Unspecified injury of unspecified wrist, hand and finger(s), initial encounter: Secondary | ICD-10-CM | POA: Diagnosis not present

## 2017-08-28 DIAGNOSIS — I639 Cerebral infarction, unspecified: Secondary | ICD-10-CM | POA: Diagnosis not present

## 2017-08-28 DIAGNOSIS — I160 Hypertensive urgency: Secondary | ICD-10-CM | POA: Diagnosis not present

## 2017-08-28 DIAGNOSIS — R531 Weakness: Secondary | ICD-10-CM | POA: Diagnosis not present

## 2017-08-28 DIAGNOSIS — D696 Thrombocytopenia, unspecified: Secondary | ICD-10-CM | POA: Diagnosis not present

## 2017-08-28 DIAGNOSIS — R079 Chest pain, unspecified: Secondary | ICD-10-CM | POA: Diagnosis not present

## 2017-08-28 DIAGNOSIS — I2510 Atherosclerotic heart disease of native coronary artery without angina pectoris: Secondary | ICD-10-CM | POA: Diagnosis not present

## 2017-08-28 DIAGNOSIS — N390 Urinary tract infection, site not specified: Secondary | ICD-10-CM | POA: Diagnosis not present

## 2017-08-28 DIAGNOSIS — I10 Essential (primary) hypertension: Secondary | ICD-10-CM | POA: Diagnosis not present

## 2017-08-28 DIAGNOSIS — E119 Type 2 diabetes mellitus without complications: Secondary | ICD-10-CM | POA: Diagnosis not present

## 2017-08-28 DIAGNOSIS — E1365 Other specified diabetes mellitus with hyperglycemia: Secondary | ICD-10-CM | POA: Diagnosis not present

## 2017-08-29 DIAGNOSIS — R079 Chest pain, unspecified: Secondary | ICD-10-CM | POA: Diagnosis not present

## 2017-08-29 DIAGNOSIS — E119 Type 2 diabetes mellitus without complications: Secondary | ICD-10-CM | POA: Diagnosis not present

## 2017-08-29 DIAGNOSIS — E871 Hypo-osmolality and hyponatremia: Secondary | ICD-10-CM | POA: Diagnosis not present

## 2017-08-29 DIAGNOSIS — E1165 Type 2 diabetes mellitus with hyperglycemia: Secondary | ICD-10-CM | POA: Diagnosis not present

## 2017-08-29 DIAGNOSIS — Z8673 Personal history of transient ischemic attack (TIA), and cerebral infarction without residual deficits: Secondary | ICD-10-CM | POA: Diagnosis not present

## 2017-08-29 DIAGNOSIS — I252 Old myocardial infarction: Secondary | ICD-10-CM | POA: Diagnosis not present

## 2017-08-29 DIAGNOSIS — N390 Urinary tract infection, site not specified: Secondary | ICD-10-CM | POA: Diagnosis not present

## 2017-08-29 DIAGNOSIS — I2510 Atherosclerotic heart disease of native coronary artery without angina pectoris: Secondary | ICD-10-CM | POA: Diagnosis not present

## 2017-08-29 DIAGNOSIS — I6529 Occlusion and stenosis of unspecified carotid artery: Secondary | ICD-10-CM | POA: Diagnosis not present

## 2017-08-29 DIAGNOSIS — I160 Hypertensive urgency: Secondary | ICD-10-CM | POA: Diagnosis not present

## 2017-08-29 DIAGNOSIS — F17210 Nicotine dependence, cigarettes, uncomplicated: Secondary | ICD-10-CM | POA: Diagnosis not present

## 2017-08-29 DIAGNOSIS — D696 Thrombocytopenia, unspecified: Secondary | ICD-10-CM | POA: Diagnosis not present

## 2017-08-29 DIAGNOSIS — G8321 Monoplegia of upper limb affecting right dominant side: Secondary | ICD-10-CM | POA: Diagnosis not present

## 2017-08-29 DIAGNOSIS — I639 Cerebral infarction, unspecified: Secondary | ICD-10-CM | POA: Diagnosis not present

## 2017-08-29 DIAGNOSIS — R531 Weakness: Secondary | ICD-10-CM | POA: Diagnosis not present

## 2017-08-29 DIAGNOSIS — R29818 Other symptoms and signs involving the nervous system: Secondary | ICD-10-CM | POA: Diagnosis not present

## 2017-08-29 DIAGNOSIS — I10 Essential (primary) hypertension: Secondary | ICD-10-CM | POA: Diagnosis not present

## 2017-08-30 DIAGNOSIS — R531 Weakness: Secondary | ICD-10-CM | POA: Diagnosis not present

## 2017-08-30 DIAGNOSIS — I639 Cerebral infarction, unspecified: Secondary | ICD-10-CM | POA: Diagnosis not present

## 2017-08-30 DIAGNOSIS — D696 Thrombocytopenia, unspecified: Secondary | ICD-10-CM | POA: Diagnosis not present

## 2017-08-30 DIAGNOSIS — I10 Essential (primary) hypertension: Secondary | ICD-10-CM | POA: Diagnosis not present

## 2017-08-30 DIAGNOSIS — I2510 Atherosclerotic heart disease of native coronary artery without angina pectoris: Secondary | ICD-10-CM | POA: Diagnosis not present

## 2017-08-30 DIAGNOSIS — E871 Hypo-osmolality and hyponatremia: Secondary | ICD-10-CM | POA: Diagnosis not present

## 2017-08-30 DIAGNOSIS — I160 Hypertensive urgency: Secondary | ICD-10-CM | POA: Diagnosis not present

## 2017-08-30 DIAGNOSIS — I4891 Unspecified atrial fibrillation: Secondary | ICD-10-CM | POA: Diagnosis not present

## 2017-08-30 DIAGNOSIS — E119 Type 2 diabetes mellitus without complications: Secondary | ICD-10-CM | POA: Diagnosis not present

## 2017-08-30 DIAGNOSIS — N390 Urinary tract infection, site not specified: Secondary | ICD-10-CM | POA: Diagnosis not present

## 2017-09-06 NOTE — Progress Notes (Signed)
This encounter was created in error - please disregard.

## 2017-09-07 ENCOUNTER — Encounter: Payer: Self-pay | Admitting: Neurology

## 2017-11-04 ENCOUNTER — Ambulatory Visit: Payer: Medicaid Other | Admitting: Neurology

## 2017-11-14 ENCOUNTER — Telehealth: Payer: Self-pay | Admitting: Neurology

## 2017-11-14 ENCOUNTER — Ambulatory Visit (INDEPENDENT_AMBULATORY_CARE_PROVIDER_SITE_OTHER): Payer: Medicare Other | Admitting: Neurology

## 2017-11-14 ENCOUNTER — Encounter (INDEPENDENT_AMBULATORY_CARE_PROVIDER_SITE_OTHER): Payer: Self-pay

## 2017-11-14 ENCOUNTER — Encounter: Payer: Self-pay | Admitting: Neurology

## 2017-11-14 ENCOUNTER — Other Ambulatory Visit: Payer: Self-pay

## 2017-11-14 DIAGNOSIS — I6350 Cerebral infarction due to unspecified occlusion or stenosis of unspecified cerebral artery: Secondary | ICD-10-CM | POA: Diagnosis not present

## 2017-11-14 DIAGNOSIS — I639 Cerebral infarction, unspecified: Secondary | ICD-10-CM | POA: Insufficient documentation

## 2017-11-14 HISTORY — DX: Cerebral infarction, unspecified: I63.9

## 2017-11-14 NOTE — Telephone Encounter (Signed)
[employer] Medicare/medicaid order sent to [facility] they will contact the patient to schedule.

## 2017-11-14 NOTE — Patient Instructions (Signed)
   We will get CT angiogram to look at the circulation of blood to the brain.  Take plavix only at this time.  QUIT SMOKING!!!

## 2017-11-14 NOTE — Progress Notes (Signed)
Reason for visit: Stroke  Referring physician: Dr. Carloyn Manner is a 67 y.o. female  History of present illness:  Brooke Hudson is a 67 year old left-handed white female with a history of diabetes, hypertension, dyslipidemia, and tobacco abuse.  The patient went into the hospital around 28 August 2017 with onset of right hand weakness that she woke up with that morning.  The patient eventually underwent MRI of the brain that showed evidence of a left pontine stroke that was acute.  The patient has small vessel changes that are chronic as well.  The patient also underwent a carotid Doppler study that did not show severe stenosis in the anterior circulation.  The patient has not had an evaluation of the posterior circulation.  A 2D echocardiogram showed ejection fraction of 55-60%.  The hemoglobin A1c was 9.1.  The patient had been on Plavix at the time of the stroke, she had a prior stroke 8 years ago.  This event left her with some slurring of speech.  The patient is blind.  She continues to smoke 1/2 pack of cigarettes daily, but multiple individuals who live with her also smoke.  The patient has had a prior right carotid endarterectomy.  the patient has a history of a diabetic peripheral neuropathy with numbness up to the knees, she walks with a walker.  The patient denies any problems controlling the bowels or the bladder.  She has had good resolution of the right hand weakness and clumsiness.  She is left with almost no deficits at this point.  Past Medical History:  Diagnosis Date  . Coronary artery disease   . Daily headache    "lately daily; normally it's weekly" (01/07/2016)  . Hypercholesterolemia   . Hypertension   . Myocardial infarction ([place]) ~ 2005  . On home oxygen therapy    "2L just at night" (01/07/2016)  . Pneumonia ~ 2016  . Shortness of breath dyspnea    "alot" (01/07/2016)  . Stroke (cerebrum) ([place]) 2010  . Type II diabetes mellitus ([place])     Past Surgical  History:  Procedure Laterality Date  . CATARACT EXTRACTION W/ INTRAOCULAR LENS  IMPLANT, BILATERAL Bilateral   . CESAREAN SECTION  1980  . CORONARY ANGIOPLASTY WITH STENT PLACEMENT  ~ 2005 X 2   "put 5 stents in w/in 1 wk; [address]"  . EYE SURGERY    . GLAUCOMA SURGERY Bilateral   . TUBAL LIGATION      Family History  Problem Relation Age of Onset  . Diabetes Mother   . Cancer - Lung Father   . Diabetes Sister   . Diabetes Other   . Heart disease Sister   . Diabetes Sister   . Hypertension Sister   . Renal Disease Sister   . COPD Sister     Social history:  reports that she has been smoking cigarettes.  She has a 18.00 pack-year smoking history. she has never used smokeless tobacco. She reports that she does not drink alcohol or use drugs.  Medications:  Prior to Admission medications   Medication Sig Start Date End Date Taking? Authorizing Provider  albuterol (PROVENTIL HFA;VENTOLIN HFA) 108 (90 Base) MCG/ACT inhaler Inhale 1 puff into the lungs every 6 (six) hours as needed for wheezing or shortness of breath.   Yes [provider]  aspirin EC 81 MG tablet Take 81 mg by mouth daily.   Yes [provider]  atenolol (TENORMIN) 50 MG tablet Take 50  mg by mouth daily.   Yes [provider]  cilostazol (PLETAL) 100 MG tablet Take 100 mg by mouth 2 (two) times daily.   Yes [provider]  clopidogrel (PLAVIX) 75 MG tablet Take 75 mg by mouth daily.   Yes [provider]  furosemide (LASIX) 20 MG tablet Take 20 mg by mouth daily.   Yes [provider]  Insulin Degludec (TRESIBA FLEXTOUCH) 100 UNIT/ML SOPN Inject 25 Units into the skin at bedtime. 01/10/16  Yes Theodis Blaze, MD  lubiprostone (AMITIZA) 8 MCG capsule Take 8 mcg by mouth 2 (two) times daily with a meal.   Yes [provider]  rosuvastatin (CRESTOR) 20 MG tablet Take 20 mg by mouth daily.   Yes [provider]  sitaGLIPtin (JANUVIA) 100 MG tablet  Take 100 mg by mouth daily.   Yes [provider]      Allergies  Allergen Reactions  . Bactrim [Sulfamethoxazole-Trimethoprim] Other (See Comments)    Dehydrates and disorients/ delusions  . Benzocaine Rash    ROS:  Out of a complete 14 system review of symptoms, the patient complains only of the following symptoms, and all other reviewed systems are negative.  Fatigue Swelling in the legs Difficulty swallowing Loss of vision, blindness, eye pain Shortness of breath, cough, wheezing, snoring Incontinence of the bladder Easy bruising, easy bleeding Increased thirst Confusion, headache, numbness, dizziness Decreased energy  Blood pressure (!) 152/66, pulse 67, height 5' 2.5" (1.588 m), weight 155 lb 8 oz (70.5 kg).  Physical Exam  General: The patient is alert and cooperative at the time of the examination.  Eyes: Pupil on the right is irregular, cannot see the disc on the right, there is a corneal opacity on the left.  Neck: The neck is supple, a right carotid bruit was noted.  Respiratory: The respiratory examination is clear.  Cardiovascular: The cardiovascular examination reveals a regular rate and rhythm, no obvious murmurs or rubs are noted.  Skin: Extremities are with 1+ edema below the knee on the right, 2+ on the left.  Neurologic Exam  Mental status: The patient is alert and oriented x 3 at the time of the examination. The patient has apparent normal recent and remote memory, with an apparently normal attention span and concentration ability.  Cranial nerves: Facial symmetry is present. There is good sensation of the face to pinprick and soft touch bilaterally. The strength of the facial muscles and the muscles to head turning and shoulder shrug are normal bilaterally. Speech is well enunciated, no aphasia or dysarthria is noted. Extraocular movements are full.  The patient is blind. The tongue is midline, and the patient has symmetric elevation of the  soft palate. No obvious hearing deficits are noted.  Motor: The motor testing reveals 5 over 5 strength of all 4 extremities. Good symmetric motor tone is noted throughout.  Sensory: Sensory testing is intact to pinprick, soft touch, vibration sensation, and position sense on the upper extremities.  With the lower extremities, there is a decrease in vibration sensation in both feet, there is a stocking pattern pinprick sensory deficit up to the knees bilaterally.  Position sense in the feet is well-maintained.  No evidence of extinction is noted.  Coordination: Cerebellar testing reveals good finger-nose-finger and heel-to-shin bilaterally.  Gait and station: Gait is wide-based, unsteady.  The patient walks with a walker.  Romberg is negative. No drift is seen.  Reflexes: Deep tendon reflexes are symmetric, but are depressed bilaterally. Toes  are downgoing bilaterally.   Assessment/Plan:  1.  Cerebrovascular disease, recent left pontine stroke  2.  Diabetes  3.  Tobacco abuse  4.  Hypertension  5.  Dyslipidemia  The patient has multiple risk factors for stroke.  She recently sustained a left pontine infarct.  The patient is on aspirin and Plavix at this point, she can drop the aspirin 90 days following the stroke event.  The patient will be sent for CT angiogram of the head and neck to look at the posterior circulation as well.  I have indicated that the patient needs to stop smoking, she has multiple family members who live with her who also smoke.  They will need to stop smoking as well.  The patient will follow-up if needed.  I will contact her with the results of the CT angiogram.  Jill Alexanders MD 11/14/2017 9:15 AM  [facility] [address] [place], [place] [address]  Phone [phone] Fax [phone]

## 2017-11-30 ENCOUNTER — Other Ambulatory Visit: Payer: Medicare Other

## 2017-12-15 ENCOUNTER — Other Ambulatory Visit: Payer: Medicare Other

## 2017-12-21 ENCOUNTER — Ambulatory Visit: Payer: Medicaid Other | Admitting: Neurology

## 2017-12-22 ENCOUNTER — Other Ambulatory Visit: Payer: Medicare Other

## 2018-01-03 ENCOUNTER — Ambulatory Visit
Admission: RE | Admit: 2018-01-03 | Discharge: 2018-01-03 | Disposition: A | Discharge status: Home / Self Care | Payer: Medicare Other | Source: Ambulatory Visit | Attending: Neurology | Admitting: Neurology

## 2018-01-03 ENCOUNTER — Telehealth: Payer: Self-pay | Admitting: Neurology

## 2018-01-03 DIAGNOSIS — I639 Cerebral infarction, unspecified: Secondary | ICD-10-CM

## 2018-01-03 DIAGNOSIS — I6350 Cerebral infarction due to unspecified occlusion or stenosis of unspecified cerebral artery: Secondary | ICD-10-CM

## 2018-01-03 MED ORDER — IOPAMIDOL (ISOVUE-370) INJECTION 76%
75.0000 mL | Freq: Once | INTRAVENOUS | Status: AC | PRN
  Administered 2018-01-03: 75 mL via INTRAVENOUS

## 2018-01-03 NOTE — Telephone Encounter (Signed)
I called the patient. I discussed the CTA results with the patient. The study showed no evidence of VBI. No change in therapy.

## 2018-01-03 NOTE — Telephone Encounter (Signed)
  I tried to call the patient.  The CT Brooke Hudson does not show evidence of severe vertebrobasilar disease, no change in medical therapy.  CTA head and Neck 01/03/18:  IMPRESSION: Extensive small vessel ischemic changes affecting the brainstem, thalami and hemispheric white matter. No sign of acute infarction by CT.  Advanced atherosclerotic calcification of the aortic arch and proximal brachiocephalic vessels but without origin stenosis. Nonstenotic atherosclerotic plaque along both common carotid arteries. Previous carotid endarterectomy on the right with wide patency and no proximal or distal stenosis. Complex soft and calcified plaque at the carotid bifurcation and ICA bulb on the left, but without stenosis greater than 20%.  50% stenosis of the right vertebral artery in the proximal 1 cm. 30% stenosis at the left vertebral artery origin.  30-50% stenoses in both carotid siphon regions.  No intracranial branch vessel stenosis affecting the large or medium size vessels.

## 2018-01-04 ENCOUNTER — Ambulatory Visit: Payer: Medicaid Other | Admitting: Neurology

## 2018-04-17 ENCOUNTER — Encounter: Payer: Medicare Other | Admitting: Neurology

## 2018-04-17 ENCOUNTER — Encounter: Payer: Self-pay | Admitting: Neurology

## 2018-04-17 ENCOUNTER — Telehealth: Payer: Self-pay | Admitting: Neurology

## 2018-04-17 ENCOUNTER — Ambulatory Visit: Payer: Medicare Other | Admitting: Neurology

## 2018-04-17 NOTE — Telephone Encounter (Signed)
This patient did not show for a prolonged re-visit today.

## 2018-06-28 DIAGNOSIS — I6529 Occlusion and stenosis of unspecified carotid artery: Secondary | ICD-10-CM | POA: Insufficient documentation

## 2018-06-28 DIAGNOSIS — H5440 Blindness, one eye, unspecified eye: Secondary | ICD-10-CM

## 2018-06-28 DIAGNOSIS — R011 Cardiac murmur, unspecified: Secondary | ICD-10-CM

## 2018-06-28 DIAGNOSIS — F17200 Nicotine dependence, unspecified, uncomplicated: Secondary | ICD-10-CM

## 2018-06-28 DIAGNOSIS — R609 Edema, unspecified: Secondary | ICD-10-CM | POA: Insufficient documentation

## 2018-06-28 DIAGNOSIS — E785 Hyperlipidemia, unspecified: Secondary | ICD-10-CM | POA: Insufficient documentation

## 2018-06-28 DIAGNOSIS — H3321 Serous retinal detachment, right eye: Secondary | ICD-10-CM | POA: Insufficient documentation

## 2018-06-28 DIAGNOSIS — M21619 Bunion of unspecified foot: Secondary | ICD-10-CM | POA: Insufficient documentation

## 2018-06-28 DIAGNOSIS — H5789 Other specified disorders of eye and adnexa: Secondary | ICD-10-CM | POA: Insufficient documentation

## 2018-06-28 DIAGNOSIS — H4050X0 Glaucoma secondary to other eye disorders, unspecified eye, stage unspecified: Secondary | ICD-10-CM | POA: Insufficient documentation

## 2018-06-28 DIAGNOSIS — H5461 Unqualified visual loss, right eye, normal vision left eye: Secondary | ICD-10-CM | POA: Insufficient documentation

## 2018-06-28 DIAGNOSIS — D649 Anemia, unspecified: Secondary | ICD-10-CM | POA: Insufficient documentation

## 2018-06-28 HISTORY — DX: Glaucoma secondary to other eye disorders, unspecified eye, stage unspecified: H40.50X0

## 2018-06-28 HISTORY — DX: Blindness, one eye, unspecified eye: H54.40

## 2018-06-28 HISTORY — DX: Hyperlipidemia, unspecified: E78.5

## 2018-06-28 HISTORY — DX: Bunion of unspecified foot: M21.619

## 2018-06-28 HISTORY — DX: Cardiac murmur, unspecified: R01.1

## 2018-06-28 HISTORY — DX: Serous retinal detachment, right eye: H33.21

## 2018-06-28 HISTORY — DX: Nicotine dependence, unspecified, uncomplicated: F17.200

## 2018-06-28 HISTORY — DX: Occlusion and stenosis of unspecified carotid artery: I65.29

## 2018-06-28 HISTORY — DX: Unqualified visual loss, right eye, normal vision left eye: H54.61

## 2018-06-28 HISTORY — DX: Edema, unspecified: R60.9

## 2018-06-28 HISTORY — DX: Other specified disorders of eye and adnexa: H57.89

## 2018-07-05 ENCOUNTER — Encounter: Payer: Self-pay | Admitting: Cardiology

## 2018-07-07 ENCOUNTER — Encounter: Payer: Self-pay | Admitting: Cardiology

## 2018-07-07 ENCOUNTER — Ambulatory Visit (INDEPENDENT_AMBULATORY_CARE_PROVIDER_SITE_OTHER): Payer: Medicare Other | Admitting: Cardiology

## 2018-07-07 VITALS — BP 132/60 | HR 71 | Ht 62.5 in | Wt 156.0 lb

## 2018-07-07 DIAGNOSIS — I10 Essential (primary) hypertension: Secondary | ICD-10-CM

## 2018-07-07 DIAGNOSIS — H5461 Unqualified visual loss, right eye, normal vision left eye: Secondary | ICD-10-CM

## 2018-07-07 DIAGNOSIS — I6350 Cerebral infarction due to unspecified occlusion or stenosis of unspecified cerebral artery: Secondary | ICD-10-CM | POA: Diagnosis not present

## 2018-07-07 DIAGNOSIS — F17210 Nicotine dependence, cigarettes, uncomplicated: Secondary | ICD-10-CM | POA: Insufficient documentation

## 2018-07-07 DIAGNOSIS — I739 Peripheral vascular disease, unspecified: Secondary | ICD-10-CM | POA: Diagnosis not present

## 2018-07-07 DIAGNOSIS — E785 Hyperlipidemia, unspecified: Secondary | ICD-10-CM

## 2018-07-07 DIAGNOSIS — I639 Cerebral infarction, unspecified: Secondary | ICD-10-CM

## 2018-07-07 DIAGNOSIS — Z01810 Encounter for preprocedural cardiovascular examination: Secondary | ICD-10-CM

## 2018-07-07 DIAGNOSIS — I2510 Atherosclerotic heart disease of native coronary artery without angina pectoris: Secondary | ICD-10-CM

## 2018-07-07 DIAGNOSIS — I6529 Occlusion and stenosis of unspecified carotid artery: Secondary | ICD-10-CM

## 2018-07-07 HISTORY — DX: Encounter for preprocedural cardiovascular examination: Z01.810

## 2018-07-07 HISTORY — DX: Nicotine dependence, cigarettes, uncomplicated: F17.210

## 2018-07-07 NOTE — Progress Notes (Signed)
Cardiology Office Note:    Date:  07/07/2018   ID:  Brooke Hudson, DOB 1951-08-01, MRN [id number]  PCP:  Maylon Cos, NP  Cardiologist:  Jenean Lindau, MD   Referring MD: Orlinda Blalock, NP    ASSESSMENT:    1. Pre-operative cardiovascular examination   2. PVD (peripheral vascular disease) with claudication ([place])   3. Left pontine stroke ([place])   4. Essential hypertension   5. Stenosis of carotid artery, unspecified laterality   6. Coronary artery disease involving native coronary artery of native heart without angina pectoris   7. Vision loss, right eye   8. Dyslipidemia   9. Cigarette smoker    PLAN:    In order of problems listed above:  1. Secondary prevention stressed with the patient.  Importance of compliance with diet and medication stressed and she vocalized understanding.  Her blood pressure is stable. 2. I spent 5 minutes with the patient discussing solely about smoking. Smoking cessation was counseled. I suggested to the patient also different medications and pharmacological interventions. Patient is keen to try stopping on its own at this time. He will get back to me if he needs any further assistance in this matter. 3. Diet was discussed with dyslipidemia and importance of compliance stressed especially with Dr. follow-up visits. 4. Echocardiogram will be done to assess murmur heard on auscultation 5. Patient will undergo Lexiscan sestamibi to assess this from an objective standpoint in evaluation of upcoming surgery.  If her stress test is negative then she is not at high risk for coronary events during the aforementioned surgery.  Meticulous hemodynamic monitoring will further reduce the risk of coronary events. 6. Patient will be seen in follow-up appointment in 6 months or earlier if the patient has any concerns    Medication Adjustments/Labs and Tests Ordered: Current medicines are reviewed at length with the patient today.  Concerns regarding medicines  are outlined above.  No orders of the defined types were placed in this encounter.  No orders of the defined types were placed in this encounter.    History of Present Illness:    Brooke Hudson is a 67 y.o. female who is being seen today for the evaluation of preop risk stratification from a cardiovascular standpoint at the request of Orlinda Blalock, NP.  Patient is a 67 year old female.  She has a significant and extensive past medical history from a cardiac standpoint.  She mentions to me that she has 5 stents in the heart.  She has history of essential hypertension, dyslipidemia, history of stroke and peripheral vascular disease and continues to smoke very significantly.  She leads a sedentary lifestyle.  She denies any history of chest pain orthopnea or PND.  She mentions to me that she was let go from her cardiologist practice because of noncompliance with appointments.  She is here for preop assessment and mentions to me that she is planning to undergo gallbladder surgery.  At the time of my evaluation, the patient is alert awake oriented and in no distress.  Past Medical History:  Diagnosis Date  . Coronary artery disease   . CVD (cardiovascular disease)   . Daily headache    "lately daily; normally it's weekly" (01/07/2016)  . Hypercholesterolemia   . Hypertension   . Left pontine stroke ([place]) 11/14/2017  . Myocardial infarction ([place]) ~ 2005  . Myocardial infarction ([place])   . On home oxygen therapy    "2L just at night" (01/07/2016)  . Pneumonia ~  2016  . Shortness of breath dyspnea    "alot" (01/07/2016)  . Stroke (cerebrum) ([place]) 2010  . TIA (transient ischemic attack)   . Type II diabetes mellitus ([place])     Past Surgical History:  Procedure Laterality Date  . CATARACT EXTRACTION W/ INTRAOCULAR LENS  IMPLANT, BILATERAL Bilateral   . CESAREAN SECTION  1980  . CORONARY ANGIOPLASTY WITH STENT PLACEMENT  ~ 2005 X 2   "put 5 stents in w/in 1 wk; [address]"  . EYE SURGERY    .  GLAUCOMA SURGERY Bilateral   . TUBAL LIGATION      Current Medications: Current Meds  Medication Sig  . albuterol (PROVENTIL HFA;VENTOLIN HFA) 108 (90 Base) MCG/ACT inhaler Inhale 1 puff into the lungs every 6 (six) hours as needed for wheezing or shortness of breath.  Marland Kitchen alendronate (FOSAMAX) 70 MG tablet Take 70 mg by mouth once a week. Take with a full glass of water on an empty stomach.  Marland Kitchen aspirin EC 81 MG tablet Take 81 mg by mouth daily.  Marland Kitchen atenolol (TENORMIN) 50 MG tablet Take 50 mg by mouth daily.  . Blood Glucose Monitoring Suppl (PRODIGY POCKET BLOOD GLUCOSE) w/Device KIT   . cilostazol (PLETAL) 100 MG tablet Take 100 mg by mouth 2 (two) times daily.  . clopidogrel (PLAVIX) 75 MG tablet Take 75 mg by mouth daily.  Marland Kitchen gabapentin (NEURONTIN) 300 MG capsule Take 1 capsule by mouth 3 (three) times daily.  Marland Kitchen glucose blood (PRODIGY NO CODING BLOOD GLUC) test strip   . Insulin Degludec (TRESIBA FLEXTOUCH) 100 UNIT/ML SOPN Inject 25 Units into the skin at bedtime.  . Insulin Pen Needle (EASY COMFORT PEN NEEDLES) 31G X 8 MM MISC by Does not apply route.  Marland Kitchen levothyroxine (SYNTHROID, LEVOTHROID) 25 MCG tablet Take 1 tablet by mouth daily.  . sitaGLIPtin (JANUVIA) 100 MG tablet Take 100 mg by mouth daily.     Allergies:   Bactrim [sulfamethoxazole-trimethoprim]; Benzocaine; and Metformin and related   Social History   Socioeconomic History  . Marital status: Divorced    Spouse name: Not on file  . Number of children: 3  . Years of education: 58  . Highest education level: Not on file  Occupational History  . Not on file  Social Needs  . Financial resource strain: Not on file  . Food insecurity:    Worry: Not on file    Inability: Not on file  . Transportation needs:    Medical: Not on file    Non-medical: Not on file  Tobacco Use  . Smoking status: Current Every Day Smoker    Packs/day: 0.50    Years: 36.00    Pack years: 18.00    Types: Cigarettes  . Smokeless tobacco:  Never Used  Substance and Sexual Activity  . Alcohol use: No  . Drug use: No  . Sexual activity: Never  Lifestyle  . Physical activity:    Days per week: Not on file    Minutes per session: Not on file  . Stress: Not on file  Relationships  . Social connections:    Talks on phone: Not on file    Gets together: Not on file    Attends religious service: Not on file    Active member of club or organization: Not on file    Attends meetings of clubs or organizations: Not on file    Relationship status: Not on file  Other Topics Concern  . Not on file  Social History Narrative   Lives with daughter    Caffeine use: Coffee daily, soda daily    Left handed      Family History: The patient's family history includes COPD in her sister; Cancer - Lung in her father; Diabetes in her mother, other, sister, and sister; Heart disease in her sister; Hypertension in her sister; Renal Disease in her sister.  ROS:   Please see the history of present illness.    All other systems reviewed and are negative.  EKGs/Labs/Other Studies Reviewed:    The following studies were reviewed today: I discussed my findings with the patient.  EKG reveals sinus rhythm left ventricular hypertrophy and marked T wave changes in multiple leads the EKG was compared with EKG available from doctor's office and was found to be largely similar.   Recent Labs: No results found for requested labs within last 8760 hours.  Recent Lipid Panel No results found for: CHOL, TRIG, HDL, CHOLHDL, VLDL, LDLCALC, LDLDIRECT  Physical Exam:    VS:  BP 132/60 (BP Location: Right Arm, Patient Position: Sitting, Cuff Size: Normal)   Pulse 71   Ht 5' 2.5" (1.588 m)   Wt 156 lb (70.8 kg)   SpO2 95%   BMI 28.08 kg/m     Wt Readings from Last 3 Encounters:  07/07/18 156 lb (70.8 kg)  11/14/17 155 lb 8 oz (70.5 kg)  01/07/16 165 lb (74.8 kg)     GEN: Patient is in no acute distress HEENT: Normal NECK: No JVD; No carotid  bruits LYMPHATICS: No lymphadenopathy CARDIAC: S1 S2 regular, 2/6 systolic murmur at the apex. RESPIRATORY:  Clear to auscultation without rales, wheezing or rhonchi  ABDOMEN: Soft, non-tender, non-distended MUSCULOSKELETAL:  No edema; No deformity  SKIN: Warm and dry NEUROLOGIC:  Alert and oriented x 3 PSYCHIATRIC:  Normal affect    Signed, Jenean Lindau, MD  07/07/2018 3:25 PM    [facility]

## 2018-07-07 NOTE — Patient Instructions (Signed)
Medication Instructions:  Your physician recommends that you continue on your current medications as directed. Please refer to the Current Medication list given to you today.  If you need a refill on your cardiac medications before your next appointment, please call your pharmacy.   Lab work: None  If you have labs (blood work) drawn today and your tests are completely normal, you will receive your results only by: Marland Kitchen MyChart Message (if you have MyChart) OR . A paper copy in the mail If you have any lab test that is abnormal or we need to change your treatment, we will call you to review the results.  Testing/Procedures: Your physician has requested that you have an echocardiogram. Echocardiography is a painless test that uses sound waves to create images of your heart. It provides your doctor with information about the size and shape of your heart and how well your heart's chambers and valves are working. This procedure takes approximately one hour. There are no restrictions for this procedure.   Your physician has requested that you have a lexiscan myoview. For further information please visit [email or web address]. Please follow instruction sheet, as given.  Follow-Up: At [facility], you and your health needs are our priority.  As part of our continuing mission to provide you with exceptional heart care, we have created designated Provider Care Teams.  These Care Teams include your primary Cardiologist (physician) and Advanced Practice Providers (APPs -  Physician Assistants and Nurse Practitioners) who all work together to provide you with the care you need, when you need it.  You will need a follow up appointment in 6 months.  Please call our office 2 months in advance to schedule this appointment.  You may see another member of our [employer] Provider Team in [place]: Jenne Campus, MD . Shirlee More, MD  Any Other Special Instructions Will Be Listed Below (If  Applicable).

## 2018-07-12 ENCOUNTER — Other Ambulatory Visit: Payer: Self-pay

## 2018-07-12 ENCOUNTER — Telehealth: Payer: Self-pay

## 2018-07-12 DIAGNOSIS — I2510 Atherosclerotic heart disease of native coronary artery without angina pectoris: Secondary | ICD-10-CM

## 2018-07-12 DIAGNOSIS — E785 Hyperlipidemia, unspecified: Secondary | ICD-10-CM

## 2018-07-12 NOTE — Telephone Encounter (Signed)
Patient was informed that she would need to come in for LFT's and FLP per Revankar. Patient understand no appointment is needed and she is to come fasting.

## 2018-07-12 NOTE — Telephone Encounter (Signed)
Attempted to reach the patient to discuss labs done at PCP. No voicemail set up, will try again later.

## 2018-07-12 NOTE — Addendum Note (Signed)
Addended by: Mattie Marlin on: 07/12/2018 03:00 PM   Modules accepted: Orders

## 2018-08-03 ENCOUNTER — Telehealth (HOSPITAL_COMMUNITY): Payer: Self-pay | Admitting: *Deleted

## 2018-08-03 NOTE — Telephone Encounter (Signed)
Patient given detailed instructions per Myocardial Perfusion Study Information Sheet for the test on 08/08/18. Patient notified to arrive 15 minutes early and that it is imperative to arrive on time for appointment to keep from having the test rescheduled.  If you need to cancel or reschedule your appointment, please call the office within 24 hours of your appointment. . Patient verbalized understanding.Brooke Hudson    

## 2018-08-08 ENCOUNTER — Ambulatory Visit (INDEPENDENT_AMBULATORY_CARE_PROVIDER_SITE_OTHER): Payer: Medicare Other

## 2018-08-08 VITALS — Ht 62.5 in | Wt 156.0 lb

## 2018-08-08 DIAGNOSIS — I2510 Atherosclerotic heart disease of native coronary artery without angina pectoris: Secondary | ICD-10-CM | POA: Diagnosis not present

## 2018-08-08 DIAGNOSIS — Z01810 Encounter for preprocedural cardiovascular examination: Secondary | ICD-10-CM

## 2018-08-08 LAB — HEPATIC FUNCTION PANEL
ALBUMIN: 4 g/dL (ref 3.6–4.8)
ALK PHOS: 88 IU/L (ref 39–117)
ALT: 11 IU/L (ref 0–32)
AST: 11 IU/L (ref 0–40)
Bilirubin Total: 0.4 mg/dL (ref 0.0–1.2)
Bilirubin, Direct: 0.11 mg/dL (ref 0.00–0.40)
Total Protein: 6.6 g/dL (ref 6.0–8.5)

## 2018-08-08 LAB — LIPID PANEL
CHOLESTEROL TOTAL: 139 mg/dL (ref 100–199)
Chol/HDL Ratio: 3.2 ratio (ref 0.0–4.4)
HDL: 44 mg/dL (ref >39)
LDL CALC: 71 mg/dL (ref 0–99)
Triglycerides: 120 mg/dL (ref 0–149)
VLDL CHOLESTEROL CAL: 24 mg/dL (ref 5–40)

## 2018-08-08 MED ORDER — REGADENOSON 0.4 MG/5ML IV SOLN
0.4000 mg | Freq: Once | INTRAVENOUS | Status: AC
  Administered 2018-08-08: 0.4 mg via INTRAVENOUS

## 2018-08-08 MED ORDER — TECHNETIUM TC 99M TETROFOSMIN IV KIT
30.7000 | PACK | Freq: Once | INTRAVENOUS | Status: AC | PRN
  Administered 2018-08-08: 30.7 via INTRAVENOUS

## 2018-08-08 MED ORDER — TECHNETIUM TC 99M TETROFOSMIN IV KIT
9.5000 | PACK | Freq: Once | INTRAVENOUS | Status: AC | PRN
  Administered 2018-08-08: 9.5 via INTRAVENOUS

## 2018-08-09 LAB — MYOCARDIAL PERFUSION IMAGING
LV dias vol: 90 mL (ref 46–106)
LV sys vol: 44 mL
Peak HR: 79 {beats}/min
Rest HR: 68 {beats}/min
SDS: 5
SRS: 4
SSS: 9
TID: 1.14

## 2018-08-10 ENCOUNTER — Telehealth: Payer: Self-pay

## 2018-08-10 NOTE — Telephone Encounter (Signed)
-----   Message from Jenean Lindau, MD sent at 08/09/2018  8:14 AM EST ----- The results of the study is unremarkable. Please inform patient. I will discuss in detail at next appointment. Cc  primary care/referring physician Jenean Lindau, MD 08/09/2018 8:14 AM

## 2018-08-10 NOTE — Telephone Encounter (Signed)
Patient called and notified of test and lab results.

## 2018-08-22 ENCOUNTER — Other Ambulatory Visit: Payer: Medicare Other

## 2018-09-01 IMAGING — CT CT ANGIO HEAD
1 of 6 series · 2 of 27 positions shown · IV contrast (APPLIED)
Comparison: MRI 08/29/2017.

CLINICAL DATA: Left pontine stroke 5 months ago. Previous carotid
endarterectomy. Dizziness and headache.

Creatinine was obtained on site at [HOSPITAL] at [HOSPITAL].
Results: Creatinine 1.1 mg/dL.
EXAM:
CT ANGIOGRAPHY HEAD AND NECK
TECHNIQUE: Multidetector CT imaging of the head and neck was performed using
the standard protocol during bolus administration of intravenous
contrast. Multiplanar CT image reconstructions and MIPs were
obtained to evaluate the vascular anatomy. Carotid stenosis
measurements (when applicable) are obtained utilizing NASCET
criteria, using the distal internal carotid diameter as the
denominator.
CONTRAST:  75mL 1SFJHG-0IB IOPAMIDOL (1SFJHG-0IB) INJECTION 76%

[Series 9: head/neck angio · axial · 0.37mm/px · z∈[-370,-10]mm · 2 of 121 slices shown]
[im 1/121  soft-tissue]
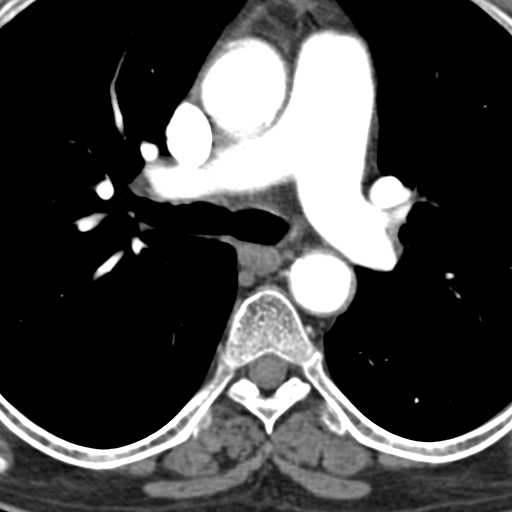
[im 121/121  bone]
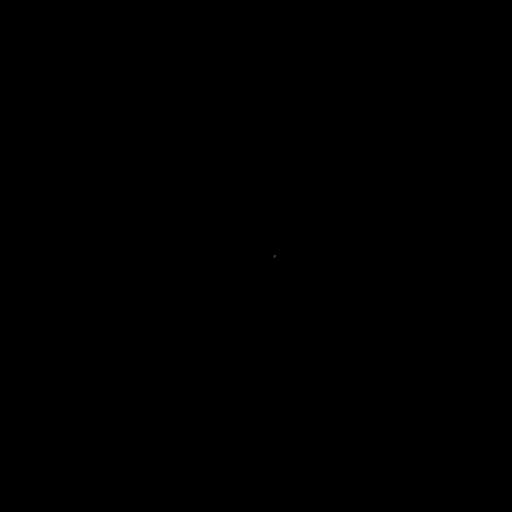

[2 of 27 positions shown; findings below may reference images not displayed]

FINDINGS: CT HEAD FINDINGS

Brain: Extensive chronic appearing small vessel ischemic changes
affect the brainstem, thalami and hemispheric deep white matter. No
sign of acute infarction, mass lesion, hemorrhage, hydrocephalus or
extra-axial collection.

Vascular: There is atherosclerotic calcification of the major
vessels at the base of the brain.

Skull: Negative

Sinuses: Clear

Orbits: Extensive calcification of the globe on the left. Presumed
glaucoma surgery on the right.

Review of the MIP images confirms the above findings

CTA NECK FINDINGS

Aortic arch: Aortic atherosclerosis. No aneurysm or dissection.
Branching pattern of the brachiocephalic vessels is normal.
Atherosclerotic calcification of the origins but without stenosis
greater than 20%.

Right carotid system: Common carotid artery shows atherosclerotic
calcification and soft plaque but no flow limiting stenosis.
Previous carotid endarterectomy at the bifurcation and ICA bulb
shows wide patency throughout that region without stenosis or
irregularity. No stenosis at the distal and. Some atherosclerotic
plaque of the cervical ICA but no stenosis.

Left carotid system: Common carotid artery shows soft and calcified
plaque but no flow limiting stenosis. Carotid bifurcation region
shows complex soft and calcified plaque. Maximal stenosis at the
distal bulb shows a diameter of 4 mm. Compared to a more distal
cervical ICA diameter of 5 mm, this indicates only a 20% stenosis.

Vertebral arteries: Both vertebral arteries are approximately equal
in size. On the right, there is 50% stenosis in the proximal 1 cm of
the vessel but wide patency beyond that. On the left, there is 30%
stenosis at the origin but wide patency beyond that.

Skeleton: Minimal cervical spondylosis.

Other neck: No mass or lymphadenopathy.

Upper chest: No lung pathology.  No upper mediastinal pathology.

Review of the MIP images confirms the above findings

CTA HEAD FINDINGS

Anterior circulation: Both internal carotid arteries are patent
through the skull base and siphon regions. There is atherosclerotic
calcification in both carotid siphon regions. Stenosis is estimated
at 30-50% in both carotid siphon regions. Supraclinoid internal
carotid arteries are widely patent. No anterior cerebral artery or
middle cerebral artery stenosis. No aneurysm or vascular
malformation.

Posterior circulation: Both vertebral arteries are patent through
the foramen magnum to the basilar. No stenosis. Basilar artery is
widely patent. Posterior circulation branch vessels appear normal.

Venous sinuses: Patent and normal.

Anatomic variants: None significant.

Delayed phase: No abnormal brain enhancement.

Review of the MIP images confirms the above findings
IMPRESSION: Extensive small vessel ischemic changes affecting the brainstem,
thalami and hemispheric white matter. No sign of acute infarction by
CT.

Advanced atherosclerotic calcification of the aortic arch and
proximal brachiocephalic vessels but without origin stenosis.
Nonstenotic atherosclerotic plaque along both common carotid
arteries. Previous carotid endarterectomy on the right with wide
patency and no proximal or distal stenosis. Complex soft and
calcified plaque at the carotid bifurcation and ICA bulb on the
left, but without stenosis greater than 20%.

50% stenosis of the right vertebral artery in the proximal 1 cm. 30%
stenosis at the left vertebral artery origin.

30-50% stenoses in both carotid siphon regions.

No intracranial branch vessel stenosis affecting the large or medium
size vessels.

## 2018-09-20 ENCOUNTER — Other Ambulatory Visit: Payer: Medicare Other

## 2018-10-03 ENCOUNTER — Telehealth: Payer: Self-pay | Admitting: *Deleted

## 2018-10-03 NOTE — Telephone Encounter (Signed)
I think it should be okay kindly call, but kindly call our pharmacist downstairs to confirm

## 2018-10-03 NOTE — Telephone Encounter (Signed)
Please advised if patient can take plavix and pletal together?

## 2018-10-03 NOTE — Telephone Encounter (Signed)
Pt called to say Dr. Leroy Sea wants pt to be on the Plavix and Pletal together. Pt to call and make sure this is alright with Dr. Geraldo Pitter. If this is okay then pt needs the Plavix called into [facility] by [facility] , 30 days worth with refills. Please advise

## 2018-10-04 MED ORDER — CLOPIDOGREL BISULFATE 75 MG PO TABS: 75.0000 mg | ORAL_TABLET | Freq: Every day | ORAL | 3 refills | Status: DC

## 2018-10-04 NOTE — Telephone Encounter (Signed)
Dr. Geraldo Pitter and Jeannene Patella, pharmacist from the pharmacy downstairs in the [facility], state that it is okay for the patient to take plavix and pletal together. Patient has been informed and verbalized understanding. Refill for plavix sent as requested. No further questions.

## 2018-10-26 ENCOUNTER — Ambulatory Visit (INDEPENDENT_AMBULATORY_CARE_PROVIDER_SITE_OTHER): Payer: Medicare Other

## 2018-10-26 DIAGNOSIS — Z01810 Encounter for preprocedural cardiovascular examination: Secondary | ICD-10-CM | POA: Diagnosis not present

## 2018-10-26 DIAGNOSIS — I2510 Atherosclerotic heart disease of native coronary artery without angina pectoris: Secondary | ICD-10-CM | POA: Diagnosis not present

## 2018-11-06 DIAGNOSIS — J441 Chronic obstructive pulmonary disease with (acute) exacerbation: Secondary | ICD-10-CM | POA: Diagnosis not present

## 2018-11-06 DIAGNOSIS — I160 Hypertensive urgency: Secondary | ICD-10-CM

## 2018-11-06 DIAGNOSIS — E1169 Type 2 diabetes mellitus with other specified complication: Secondary | ICD-10-CM

## 2018-11-06 DIAGNOSIS — Z720 Tobacco use: Secondary | ICD-10-CM

## 2018-11-06 DIAGNOSIS — J9621 Acute and chronic respiratory failure with hypoxia: Secondary | ICD-10-CM | POA: Diagnosis not present

## 2018-11-06 DIAGNOSIS — E871 Hypo-osmolality and hyponatremia: Secondary | ICD-10-CM

## 2018-11-06 DIAGNOSIS — N179 Acute kidney failure, unspecified: Secondary | ICD-10-CM

## 2018-11-06 DIAGNOSIS — I5032 Chronic diastolic (congestive) heart failure: Secondary | ICD-10-CM | POA: Diagnosis not present

## 2018-11-06 DIAGNOSIS — L03116 Cellulitis of left lower limb: Secondary | ICD-10-CM

## 2018-11-07 DIAGNOSIS — J441 Chronic obstructive pulmonary disease with (acute) exacerbation: Secondary | ICD-10-CM | POA: Diagnosis not present

## 2018-11-07 DIAGNOSIS — J9621 Acute and chronic respiratory failure with hypoxia: Secondary | ICD-10-CM | POA: Diagnosis not present

## 2018-11-07 DIAGNOSIS — I5032 Chronic diastolic (congestive) heart failure: Secondary | ICD-10-CM | POA: Diagnosis not present

## 2018-11-07 DIAGNOSIS — N179 Acute kidney failure, unspecified: Secondary | ICD-10-CM | POA: Diagnosis not present

## 2018-11-08 DIAGNOSIS — L03119 Cellulitis of unspecified part of limb: Secondary | ICD-10-CM

## 2018-11-08 DIAGNOSIS — R7989 Other specified abnormal findings of blood chemistry: Secondary | ICD-10-CM | POA: Diagnosis not present

## 2018-11-08 DIAGNOSIS — E875 Hyperkalemia: Secondary | ICD-10-CM

## 2018-11-08 DIAGNOSIS — E039 Hypothyroidism, unspecified: Secondary | ICD-10-CM

## 2018-11-08 DIAGNOSIS — J9621 Acute and chronic respiratory failure with hypoxia: Secondary | ICD-10-CM | POA: Diagnosis not present

## 2018-11-08 DIAGNOSIS — I10 Essential (primary) hypertension: Secondary | ICD-10-CM

## 2018-11-09 DIAGNOSIS — E875 Hyperkalemia: Secondary | ICD-10-CM | POA: Diagnosis not present

## 2018-11-09 DIAGNOSIS — E039 Hypothyroidism, unspecified: Secondary | ICD-10-CM | POA: Diagnosis not present

## 2018-11-09 DIAGNOSIS — R7989 Other specified abnormal findings of blood chemistry: Secondary | ICD-10-CM | POA: Diagnosis not present

## 2018-11-09 DIAGNOSIS — J9621 Acute and chronic respiratory failure with hypoxia: Secondary | ICD-10-CM | POA: Diagnosis not present

## 2018-11-10 DIAGNOSIS — E875 Hyperkalemia: Secondary | ICD-10-CM | POA: Diagnosis not present

## 2018-11-10 DIAGNOSIS — J9621 Acute and chronic respiratory failure with hypoxia: Secondary | ICD-10-CM | POA: Diagnosis not present

## 2018-11-10 DIAGNOSIS — E039 Hypothyroidism, unspecified: Secondary | ICD-10-CM | POA: Diagnosis not present

## 2018-11-10 DIAGNOSIS — R7989 Other specified abnormal findings of blood chemistry: Secondary | ICD-10-CM | POA: Diagnosis not present

## 2018-11-11 DIAGNOSIS — R7989 Other specified abnormal findings of blood chemistry: Secondary | ICD-10-CM | POA: Diagnosis not present

## 2018-11-11 DIAGNOSIS — J9621 Acute and chronic respiratory failure with hypoxia: Secondary | ICD-10-CM | POA: Diagnosis not present

## 2018-11-11 DIAGNOSIS — E875 Hyperkalemia: Secondary | ICD-10-CM | POA: Diagnosis not present

## 2018-11-11 DIAGNOSIS — E039 Hypothyroidism, unspecified: Secondary | ICD-10-CM | POA: Diagnosis not present

## 2018-11-12 DIAGNOSIS — J9621 Acute and chronic respiratory failure with hypoxia: Secondary | ICD-10-CM | POA: Diagnosis not present

## 2018-11-12 DIAGNOSIS — R7989 Other specified abnormal findings of blood chemistry: Secondary | ICD-10-CM | POA: Diagnosis not present

## 2018-11-12 DIAGNOSIS — E875 Hyperkalemia: Secondary | ICD-10-CM | POA: Diagnosis not present

## 2018-11-12 DIAGNOSIS — E039 Hypothyroidism, unspecified: Secondary | ICD-10-CM | POA: Diagnosis not present

## 2018-11-13 DIAGNOSIS — E875 Hyperkalemia: Secondary | ICD-10-CM | POA: Diagnosis not present

## 2018-11-13 DIAGNOSIS — J9621 Acute and chronic respiratory failure with hypoxia: Secondary | ICD-10-CM | POA: Diagnosis not present

## 2018-11-13 DIAGNOSIS — E039 Hypothyroidism, unspecified: Secondary | ICD-10-CM | POA: Diagnosis not present

## 2018-11-13 DIAGNOSIS — R7989 Other specified abnormal findings of blood chemistry: Secondary | ICD-10-CM | POA: Diagnosis not present

## 2018-11-25 DIAGNOSIS — N179 Acute kidney failure, unspecified: Secondary | ICD-10-CM

## 2018-11-25 DIAGNOSIS — I5033 Acute on chronic diastolic (congestive) heart failure: Secondary | ICD-10-CM

## 2018-11-25 DIAGNOSIS — J441 Chronic obstructive pulmonary disease with (acute) exacerbation: Secondary | ICD-10-CM

## 2018-11-25 DIAGNOSIS — J9621 Acute and chronic respiratory failure with hypoxia: Secondary | ICD-10-CM

## 2018-11-25 DIAGNOSIS — D61818 Other pancytopenia: Secondary | ICD-10-CM

## 2018-11-25 DIAGNOSIS — J690 Pneumonitis due to inhalation of food and vomit: Secondary | ICD-10-CM

## 2018-11-25 DIAGNOSIS — I110 Hypertensive heart disease with heart failure: Secondary | ICD-10-CM

## 2018-11-25 DIAGNOSIS — J189 Pneumonia, unspecified organism: Secondary | ICD-10-CM

## 2018-11-25 DIAGNOSIS — J9622 Acute and chronic respiratory failure with hypercapnia: Secondary | ICD-10-CM

## 2018-11-26 DIAGNOSIS — R0602 Shortness of breath: Secondary | ICD-10-CM

## 2018-11-26 DIAGNOSIS — J9622 Acute and chronic respiratory failure with hypercapnia: Secondary | ICD-10-CM | POA: Diagnosis not present

## 2018-11-26 DIAGNOSIS — J189 Pneumonia, unspecified organism: Secondary | ICD-10-CM | POA: Diagnosis not present

## 2018-11-26 DIAGNOSIS — J690 Pneumonitis due to inhalation of food and vomit: Secondary | ICD-10-CM | POA: Diagnosis not present

## 2018-11-26 DIAGNOSIS — I5033 Acute on chronic diastolic (congestive) heart failure: Secondary | ICD-10-CM | POA: Diagnosis not present

## 2018-11-27 DIAGNOSIS — J690 Pneumonitis due to inhalation of food and vomit: Secondary | ICD-10-CM | POA: Diagnosis not present

## 2018-11-27 DIAGNOSIS — I5033 Acute on chronic diastolic (congestive) heart failure: Secondary | ICD-10-CM | POA: Diagnosis not present

## 2018-11-27 DIAGNOSIS — J189 Pneumonia, unspecified organism: Secondary | ICD-10-CM | POA: Diagnosis not present

## 2018-11-27 DIAGNOSIS — J9622 Acute and chronic respiratory failure with hypercapnia: Secondary | ICD-10-CM | POA: Diagnosis not present

## 2018-11-28 DIAGNOSIS — J690 Pneumonitis due to inhalation of food and vomit: Secondary | ICD-10-CM | POA: Diagnosis not present

## 2018-11-28 DIAGNOSIS — I5033 Acute on chronic diastolic (congestive) heart failure: Secondary | ICD-10-CM | POA: Diagnosis not present

## 2018-11-28 DIAGNOSIS — J189 Pneumonia, unspecified organism: Secondary | ICD-10-CM | POA: Diagnosis not present

## 2018-11-28 DIAGNOSIS — J9622 Acute and chronic respiratory failure with hypercapnia: Secondary | ICD-10-CM | POA: Diagnosis not present

## 2018-11-29 DIAGNOSIS — J189 Pneumonia, unspecified organism: Secondary | ICD-10-CM | POA: Diagnosis not present

## 2018-11-29 DIAGNOSIS — J690 Pneumonitis due to inhalation of food and vomit: Secondary | ICD-10-CM | POA: Diagnosis not present

## 2018-11-29 DIAGNOSIS — I5033 Acute on chronic diastolic (congestive) heart failure: Secondary | ICD-10-CM | POA: Diagnosis not present

## 2018-11-29 DIAGNOSIS — R0602 Shortness of breath: Secondary | ICD-10-CM | POA: Diagnosis not present

## 2018-11-29 DIAGNOSIS — J9622 Acute and chronic respiratory failure with hypercapnia: Secondary | ICD-10-CM | POA: Diagnosis not present

## 2018-11-29 DIAGNOSIS — I340 Nonrheumatic mitral (valve) insufficiency: Secondary | ICD-10-CM | POA: Diagnosis not present

## 2018-11-30 DIAGNOSIS — I5033 Acute on chronic diastolic (congestive) heart failure: Secondary | ICD-10-CM | POA: Diagnosis not present

## 2018-11-30 DIAGNOSIS — J9622 Acute and chronic respiratory failure with hypercapnia: Secondary | ICD-10-CM | POA: Diagnosis not present

## 2018-11-30 DIAGNOSIS — J189 Pneumonia, unspecified organism: Secondary | ICD-10-CM | POA: Diagnosis not present

## 2018-11-30 DIAGNOSIS — J690 Pneumonitis due to inhalation of food and vomit: Secondary | ICD-10-CM | POA: Diagnosis not present

## 2018-12-01 DIAGNOSIS — J690 Pneumonitis due to inhalation of food and vomit: Secondary | ICD-10-CM | POA: Diagnosis not present

## 2018-12-01 DIAGNOSIS — I5033 Acute on chronic diastolic (congestive) heart failure: Secondary | ICD-10-CM | POA: Diagnosis not present

## 2018-12-01 DIAGNOSIS — J9622 Acute and chronic respiratory failure with hypercapnia: Secondary | ICD-10-CM | POA: Diagnosis not present

## 2018-12-01 DIAGNOSIS — J189 Pneumonia, unspecified organism: Secondary | ICD-10-CM | POA: Diagnosis not present

## 2018-12-02 DIAGNOSIS — J690 Pneumonitis due to inhalation of food and vomit: Secondary | ICD-10-CM | POA: Diagnosis not present

## 2018-12-02 DIAGNOSIS — I5033 Acute on chronic diastolic (congestive) heart failure: Secondary | ICD-10-CM | POA: Diagnosis not present

## 2018-12-02 DIAGNOSIS — J189 Pneumonia, unspecified organism: Secondary | ICD-10-CM | POA: Diagnosis not present

## 2018-12-02 DIAGNOSIS — J9622 Acute and chronic respiratory failure with hypercapnia: Secondary | ICD-10-CM | POA: Diagnosis not present

## 2018-12-03 DIAGNOSIS — J9622 Acute and chronic respiratory failure with hypercapnia: Secondary | ICD-10-CM | POA: Diagnosis not present

## 2018-12-03 DIAGNOSIS — J189 Pneumonia, unspecified organism: Secondary | ICD-10-CM | POA: Diagnosis not present

## 2018-12-03 DIAGNOSIS — J690 Pneumonitis due to inhalation of food and vomit: Secondary | ICD-10-CM | POA: Diagnosis not present

## 2018-12-03 DIAGNOSIS — I5033 Acute on chronic diastolic (congestive) heart failure: Secondary | ICD-10-CM | POA: Diagnosis not present

## 2018-12-04 DIAGNOSIS — I5033 Acute on chronic diastolic (congestive) heart failure: Secondary | ICD-10-CM | POA: Diagnosis not present

## 2018-12-04 DIAGNOSIS — J690 Pneumonitis due to inhalation of food and vomit: Secondary | ICD-10-CM | POA: Diagnosis not present

## 2018-12-04 DIAGNOSIS — J189 Pneumonia, unspecified organism: Secondary | ICD-10-CM | POA: Diagnosis not present

## 2018-12-04 DIAGNOSIS — J9622 Acute and chronic respiratory failure with hypercapnia: Secondary | ICD-10-CM | POA: Diagnosis not present

## 2018-12-13 ENCOUNTER — Telehealth: Payer: Self-pay

## 2018-12-13 NOTE — Telephone Encounter (Signed)
Called patient to schedule for 6 mo f/u appt (recall). Not able to leave vm message.

## 2019-01-17 ENCOUNTER — Telehealth: Payer: Self-pay | Admitting: *Deleted

## 2019-01-17 MED ORDER — CLOPIDOGREL BISULFATE 75 MG PO TABS: 75.0000 mg | ORAL_TABLET | Freq: Every day | ORAL | 1 refills | Status: DC

## 2019-01-17 NOTE — Telephone Encounter (Signed)
Rx refill sent to pharmacy.  *STAT* If patient is at the pharmacy, call can be transferred to refill team.   1. Which medications need to be refilled? (please list name of each medication and dose if known) Clopidogrel 75mg  qd  2. Which pharmacy/location (including street and city if local pharmacy) is medication to be sent to?Pill pack  3. Do they need a 30 day or 90 day supply? [place]

## 2019-01-29 DIAGNOSIS — Z794 Long term (current) use of insulin: Secondary | ICD-10-CM | POA: Diagnosis not present

## 2019-01-29 DIAGNOSIS — I110 Hypertensive heart disease with heart failure: Secondary | ICD-10-CM | POA: Diagnosis not present

## 2019-01-29 DIAGNOSIS — Z7982 Long term (current) use of aspirin: Secondary | ICD-10-CM | POA: Diagnosis not present

## 2019-01-29 DIAGNOSIS — Z9981 Dependence on supplemental oxygen: Secondary | ICD-10-CM | POA: Diagnosis not present

## 2019-01-29 DIAGNOSIS — Z8673 Personal history of transient ischemic attack (TIA), and cerebral infarction without residual deficits: Secondary | ICD-10-CM | POA: Diagnosis not present

## 2019-01-29 DIAGNOSIS — J441 Chronic obstructive pulmonary disease with (acute) exacerbation: Secondary | ICD-10-CM | POA: Diagnosis not present

## 2019-01-29 DIAGNOSIS — H548 Legal blindness, as defined in USA: Secondary | ICD-10-CM | POA: Diagnosis not present

## 2019-01-29 DIAGNOSIS — I252 Old myocardial infarction: Secondary | ICD-10-CM | POA: Diagnosis not present

## 2019-01-29 DIAGNOSIS — J9611 Chronic respiratory failure with hypoxia: Secondary | ICD-10-CM | POA: Diagnosis not present

## 2019-01-29 DIAGNOSIS — I2510 Atherosclerotic heart disease of native coronary artery without angina pectoris: Secondary | ICD-10-CM | POA: Diagnosis not present

## 2019-01-29 DIAGNOSIS — E039 Hypothyroidism, unspecified: Secondary | ICD-10-CM | POA: Diagnosis not present

## 2019-01-29 DIAGNOSIS — E119 Type 2 diabetes mellitus without complications: Secondary | ICD-10-CM | POA: Diagnosis not present

## 2019-01-29 DIAGNOSIS — Z87440 Personal history of urinary (tract) infections: Secondary | ICD-10-CM | POA: Diagnosis not present

## 2019-01-29 DIAGNOSIS — I5032 Chronic diastolic (congestive) heart failure: Secondary | ICD-10-CM | POA: Diagnosis not present

## 2019-02-01 DIAGNOSIS — I509 Heart failure, unspecified: Secondary | ICD-10-CM | POA: Diagnosis not present

## 2019-02-01 DIAGNOSIS — J449 Chronic obstructive pulmonary disease, unspecified: Secondary | ICD-10-CM | POA: Diagnosis not present

## 2019-02-01 DIAGNOSIS — R0902 Hypoxemia: Secondary | ICD-10-CM | POA: Diagnosis not present

## 2019-02-02 DIAGNOSIS — Z9981 Dependence on supplemental oxygen: Secondary | ICD-10-CM | POA: Diagnosis not present

## 2019-02-02 DIAGNOSIS — I252 Old myocardial infarction: Secondary | ICD-10-CM | POA: Diagnosis not present

## 2019-02-02 DIAGNOSIS — I5032 Chronic diastolic (congestive) heart failure: Secondary | ICD-10-CM | POA: Diagnosis not present

## 2019-02-02 DIAGNOSIS — E119 Type 2 diabetes mellitus without complications: Secondary | ICD-10-CM | POA: Diagnosis not present

## 2019-02-02 DIAGNOSIS — J9611 Chronic respiratory failure with hypoxia: Secondary | ICD-10-CM | POA: Diagnosis not present

## 2019-02-02 DIAGNOSIS — Z87440 Personal history of urinary (tract) infections: Secondary | ICD-10-CM | POA: Diagnosis not present

## 2019-02-02 DIAGNOSIS — E039 Hypothyroidism, unspecified: Secondary | ICD-10-CM | POA: Diagnosis not present

## 2019-02-02 DIAGNOSIS — I2510 Atherosclerotic heart disease of native coronary artery without angina pectoris: Secondary | ICD-10-CM | POA: Diagnosis not present

## 2019-02-02 DIAGNOSIS — H548 Legal blindness, as defined in USA: Secondary | ICD-10-CM | POA: Diagnosis not present

## 2019-02-02 DIAGNOSIS — Z8673 Personal history of transient ischemic attack (TIA), and cerebral infarction without residual deficits: Secondary | ICD-10-CM | POA: Diagnosis not present

## 2019-02-02 DIAGNOSIS — J441 Chronic obstructive pulmonary disease with (acute) exacerbation: Secondary | ICD-10-CM | POA: Diagnosis not present

## 2019-02-02 DIAGNOSIS — I110 Hypertensive heart disease with heart failure: Secondary | ICD-10-CM | POA: Diagnosis not present

## 2019-02-02 DIAGNOSIS — Z794 Long term (current) use of insulin: Secondary | ICD-10-CM | POA: Diagnosis not present

## 2019-02-02 DIAGNOSIS — Z7982 Long term (current) use of aspirin: Secondary | ICD-10-CM | POA: Diagnosis not present

## 2019-02-05 DIAGNOSIS — Z794 Long term (current) use of insulin: Secondary | ICD-10-CM | POA: Diagnosis not present

## 2019-02-05 DIAGNOSIS — J9612 Chronic respiratory failure with hypercapnia: Secondary | ICD-10-CM | POA: Diagnosis not present

## 2019-02-05 DIAGNOSIS — G629 Polyneuropathy, unspecified: Secondary | ICD-10-CM | POA: Diagnosis not present

## 2019-02-05 DIAGNOSIS — E039 Hypothyroidism, unspecified: Secondary | ICD-10-CM | POA: Diagnosis not present

## 2019-02-05 DIAGNOSIS — Z720 Tobacco use: Secondary | ICD-10-CM | POA: Diagnosis not present

## 2019-02-05 DIAGNOSIS — E1140 Type 2 diabetes mellitus with diabetic neuropathy, unspecified: Secondary | ICD-10-CM | POA: Diagnosis not present

## 2019-02-09 DIAGNOSIS — J449 Chronic obstructive pulmonary disease, unspecified: Secondary | ICD-10-CM | POA: Diagnosis not present

## 2019-02-12 DIAGNOSIS — D500 Iron deficiency anemia secondary to blood loss (chronic): Secondary | ICD-10-CM | POA: Diagnosis not present

## 2019-02-13 DIAGNOSIS — D500 Iron deficiency anemia secondary to blood loss (chronic): Secondary | ICD-10-CM | POA: Diagnosis not present

## 2019-02-14 DIAGNOSIS — I509 Heart failure, unspecified: Secondary | ICD-10-CM | POA: Diagnosis not present

## 2019-02-14 DIAGNOSIS — J449 Chronic obstructive pulmonary disease, unspecified: Secondary | ICD-10-CM | POA: Diagnosis not present

## 2019-02-14 DIAGNOSIS — R0902 Hypoxemia: Secondary | ICD-10-CM | POA: Diagnosis not present

## 2019-02-15 DIAGNOSIS — K5900 Constipation, unspecified: Secondary | ICD-10-CM | POA: Diagnosis not present

## 2019-02-15 DIAGNOSIS — R195 Other fecal abnormalities: Secondary | ICD-10-CM | POA: Diagnosis not present

## 2019-02-15 DIAGNOSIS — D500 Iron deficiency anemia secondary to blood loss (chronic): Secondary | ICD-10-CM | POA: Diagnosis not present

## 2019-03-03 DIAGNOSIS — J449 Chronic obstructive pulmonary disease, unspecified: Secondary | ICD-10-CM | POA: Diagnosis not present

## 2019-03-03 DIAGNOSIS — I509 Heart failure, unspecified: Secondary | ICD-10-CM | POA: Diagnosis not present

## 2019-03-03 DIAGNOSIS — R0902 Hypoxemia: Secondary | ICD-10-CM | POA: Diagnosis not present

## 2019-03-11 DIAGNOSIS — J449 Chronic obstructive pulmonary disease, unspecified: Secondary | ICD-10-CM | POA: Diagnosis not present

## 2019-03-14 DIAGNOSIS — Z87440 Personal history of urinary (tract) infections: Secondary | ICD-10-CM | POA: Diagnosis not present

## 2019-03-14 DIAGNOSIS — E1140 Type 2 diabetes mellitus with diabetic neuropathy, unspecified: Secondary | ICD-10-CM | POA: Diagnosis not present

## 2019-03-14 DIAGNOSIS — D649 Anemia, unspecified: Secondary | ICD-10-CM | POA: Diagnosis not present

## 2019-03-14 DIAGNOSIS — R0781 Pleurodynia: Secondary | ICD-10-CM | POA: Diagnosis not present

## 2019-03-14 DIAGNOSIS — I10 Essential (primary) hypertension: Secondary | ICD-10-CM | POA: Diagnosis not present

## 2019-03-14 DIAGNOSIS — E039 Hypothyroidism, unspecified: Secondary | ICD-10-CM | POA: Diagnosis not present

## 2019-03-14 DIAGNOSIS — E785 Hyperlipidemia, unspecified: Secondary | ICD-10-CM | POA: Diagnosis not present

## 2019-03-14 DIAGNOSIS — S2232XA Fracture of one rib, left side, initial encounter for closed fracture: Secondary | ICD-10-CM | POA: Diagnosis not present

## 2019-03-16 DIAGNOSIS — R0902 Hypoxemia: Secondary | ICD-10-CM | POA: Diagnosis not present

## 2019-03-16 DIAGNOSIS — I509 Heart failure, unspecified: Secondary | ICD-10-CM | POA: Diagnosis not present

## 2019-03-16 DIAGNOSIS — J449 Chronic obstructive pulmonary disease, unspecified: Secondary | ICD-10-CM | POA: Diagnosis not present

## 2019-03-30 DIAGNOSIS — J449 Chronic obstructive pulmonary disease, unspecified: Secondary | ICD-10-CM | POA: Diagnosis not present

## 2019-04-03 DIAGNOSIS — I509 Heart failure, unspecified: Secondary | ICD-10-CM | POA: Diagnosis not present

## 2019-04-03 DIAGNOSIS — J449 Chronic obstructive pulmonary disease, unspecified: Secondary | ICD-10-CM | POA: Diagnosis not present

## 2019-04-03 DIAGNOSIS — R0902 Hypoxemia: Secondary | ICD-10-CM | POA: Diagnosis not present

## 2019-04-05 DIAGNOSIS — E1140 Type 2 diabetes mellitus with diabetic neuropathy, unspecified: Secondary | ICD-10-CM | POA: Diagnosis not present

## 2019-04-05 DIAGNOSIS — N390 Urinary tract infection, site not specified: Secondary | ICD-10-CM | POA: Diagnosis not present

## 2019-04-05 DIAGNOSIS — E11621 Type 2 diabetes mellitus with foot ulcer: Secondary | ICD-10-CM | POA: Diagnosis not present

## 2019-04-05 DIAGNOSIS — L97519 Non-pressure chronic ulcer of other part of right foot with unspecified severity: Secondary | ICD-10-CM | POA: Diagnosis not present

## 2019-04-05 DIAGNOSIS — Z794 Long term (current) use of insulin: Secondary | ICD-10-CM | POA: Diagnosis not present

## 2019-04-06 ENCOUNTER — Other Ambulatory Visit: Payer: Self-pay | Admitting: Sports Medicine

## 2019-04-06 ENCOUNTER — Other Ambulatory Visit: Payer: Self-pay

## 2019-04-06 ENCOUNTER — Ambulatory Visit (INDEPENDENT_AMBULATORY_CARE_PROVIDER_SITE_OTHER): Payer: Medicare Other | Admitting: Sports Medicine

## 2019-04-06 ENCOUNTER — Encounter: Payer: Self-pay | Admitting: Sports Medicine

## 2019-04-06 DIAGNOSIS — Z79899 Other long term (current) drug therapy: Secondary | ICD-10-CM | POA: Diagnosis not present

## 2019-04-06 DIAGNOSIS — M21619 Bunion of unspecified foot: Secondary | ICD-10-CM | POA: Diagnosis not present

## 2019-04-06 DIAGNOSIS — N390 Urinary tract infection, site not specified: Secondary | ICD-10-CM | POA: Diagnosis not present

## 2019-04-06 DIAGNOSIS — M79671 Pain in right foot: Secondary | ICD-10-CM

## 2019-04-06 DIAGNOSIS — R339 Retention of urine, unspecified: Secondary | ICD-10-CM | POA: Diagnosis not present

## 2019-04-06 DIAGNOSIS — L03031 Cellulitis of right toe: Secondary | ICD-10-CM | POA: Diagnosis not present

## 2019-04-06 DIAGNOSIS — L02611 Cutaneous abscess of right foot: Secondary | ICD-10-CM | POA: Diagnosis not present

## 2019-04-06 DIAGNOSIS — E1140 Type 2 diabetes mellitus with diabetic neuropathy, unspecified: Secondary | ICD-10-CM

## 2019-04-06 DIAGNOSIS — L97511 Non-pressure chronic ulcer of other part of right foot limited to breakdown of skin: Secondary | ICD-10-CM | POA: Diagnosis not present

## 2019-04-06 NOTE — Progress Notes (Signed)
Subjective: Brooke Hudson is a 68 y.o. female patient seen in office for evaluation of ulceration of the right first toe. Patient has a history of diabetes and a blood glucose level today that was not recorded but on yesterday was 230.  Last A1c 8.  Patient is is assisted by granddaughter who helps with history and reports that they noticed doing area changing about the last 2 to 3 weeks states that there is some mild pain to the area patient ranks pain 4 out of 10 and granddaughter admits that in the past her grandmother has suffered with diabetic ulcer as well as her mom who has passed away.  Denies nausea/fever/vomiting/chills/night sweats/shortness of breath/pain. Patient has no other pedal complaints at this time.  Review of Systems  Skin:       Toe wound  All other systems reviewed and are negative.    Patient Active Problem List   Diagnosis Date Noted  . Pre-operative cardiovascular examination 07/07/2018  . Cigarette smoker 07/07/2018  . Anemia 06/28/2018  . 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 Carotid artery stenosis 06/28/2018  . Deposits on intraocular lens 06/28/2018  . Detached retina, right 06/28/2018  . Dyslipidemia 06/28/2018  . Edema 06/28/2018  . Bunion of great toe 06/28/2018  . Murmur, cardiac 06/28/2018  . 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 Smoker 06/28/2018  . Vision loss, right eye 06/28/2018  . Left pontine stroke ([place]) 11/14/2017  . Orbital cellulitis 01/07/2016  . Essential hypertension 01/07/2016  . DM type 2 causing eye disease, not at goal [facility]) 01/07/2016  . CAD (coronary artery disease), native coronary artery 01/07/2016  . Peripheral edema 10/15/2015  . Encounter for long-term (current) use of other medications 02/22/2012  . Profound vision impairment both eyes 12/01/2011  . PVD (peripheral vascular disease) with claudication ([place]) 04/20/2011   Current Outpatient Medications on File Prior to Visit  Medication Sig Dispense Refill  . albuterol (PROVENTIL  HFA;VENTOLIN HFA) 108 (90 Base) MCG/ACT inhaler Inhale 1 puff into the lungs every 6 (six) hours as needed for wheezing or shortness of breath.    Marland Kitchen alendronate (FOSAMAX) 70 MG tablet Take 70 mg by mouth once a week. Take with a full glass of water on an empty stomach.    Marland Kitchen aspirin EC 81 MG tablet Take 81 mg by mouth daily.    Marland Kitchen atenolol (TENORMIN) 50 MG tablet Take 50 mg by mouth daily.    . Blood Glucose Monitoring Suppl (PRODIGY POCKET BLOOD GLUCOSE) w/Device KIT     . cilostazol (PLETAL) 100 MG tablet Take 100 mg by mouth 2 (two) times daily.    . clopidogrel (PLAVIX) 75 MG tablet Take 1 tablet (75 mg total) by mouth daily. 90 tablet 1  . gabapentin (NEURONTIN) 300 MG capsule Take 1 capsule by mouth 3 (three) times daily.    Marland Kitchen glucose blood (PRODIGY NO CODING BLOOD GLUC) test strip     . Insulin Degludec (TRESIBA FLEXTOUCH) 100 UNIT/ML SOPN Inject 25 Units into the skin at bedtime. 1 pen 3  . Insulin Pen Needle (EASY COMFORT PEN NEEDLES) 31G X 8 MM MISC by Does not apply route.    Marland Kitchen levothyroxine (SYNTHROID, LEVOTHROID) 25 MCG tablet Take 1 tablet by mouth daily.    . sitaGLIPtin (JANUVIA) 100 MG tablet Take 100 mg by mouth daily.     No current facility-administered medications on file prior to visit.    Allergies  Allergen Reactions  . Bactrim [Sulfamethoxazole-Trimethoprim] Other (See Comments)  Dehydrates and disorients/ delusions  . Benzocaine Rash  . Metformin And Related Diarrhea    No results found for this or any previous visit (from the past 2160 hour(s)).  Objective: There were no vitals filed for this visit.  General: Patient is awake, alert, oriented x 3 and in no acute distress.  Dermatology: Skin is warm and dry bilateral with a partial thickness ulceration at the right great toe plantar medial aspect.  Ulceration measures1 cm x 0.5 cm x 0.1 cm. There is a Keratotic border with a granular base. The ulceration does not probe to bone. There is no malodor, no active  drainage, no erythema, no edema. No acute signs of infection.   Vascular: Dorsalis Pedis pulse = 1/4 Bilateral,  Posterior Tibial pulse = 0/4 Bilateral, 1+ pitting edema on the left no or very minimal edema on right, capillary Fill Time < 5 seconds  Neurologic: Protective sensation severely diminished using Weinstein monofilament.  Vibratory absent bilateral.  Musculosketal: There is bunion and hammertoe deformity.  There is mild pain to palpation to ulcerated area at right great toe.    No results for input(s): GRAMSTAIN, LABORGA in the last 8760 hours.  Assessment and Plan:  Problem List Items Addressed This Visit      Musculoskeletal and Integument   Bunion of great toe    Other Visit Diagnoses    Toe ulcer, right, limited to breakdown of skin ([place])    -  Primary   Relevant Orders   WOUND CULTURE   Cellulitis and abscess of toe of right foot       Relevant Orders   WOUND CULTURE   Type 2 diabetes mellitus with diabetic neuropathy, unspecified whether long term insulin use ([place])           -Examined patient and discussed the progression of the wound and treatment alternatives. -Granddaughter declined x-rays and reports that she has to go to work and does not want to wait for x-rays to be done - Excisionally dedbrided ulceration at right great toe to healthy bleeding borders removing nonviable tissue using a sterile chisel blade. Wound measures post debridement as above.  Wound was debrided to the level of the dermis with viable wound base exposed to promote healing. Hemostasis was achieved with manuel pressure. Patient tolerated procedure well without any discomfort or anesthesia necessary for this wound debridement.  -Wound culture obtained will call patient if there is a need for oral antibiotics -Applied topical antibiotic cream and dry sterile dressing and instructed patient to continue with daily dressings at home consisting of the same with help from granddaughter. -Continue  with a shoe that does not rub or irritate the toe - Advised patient to go to the ER or return to office if the wound worsens or if constitutional symptoms are present. -Patient to return to office in 2 weeks for follow up care and evaluation or sooner if problems arise.  Advised granddaughter if the wound seems like it is not healing we will proceed with ordering vascular studies.  Landis Martins, DPM

## 2019-04-08 LAB — WOUND CULTURE: Organism ID, Bacteria: NONE SEEN

## 2019-04-09 ENCOUNTER — Telehealth: Payer: Self-pay

## 2019-04-09 DIAGNOSIS — D500 Iron deficiency anemia secondary to blood loss (chronic): Secondary | ICD-10-CM | POA: Diagnosis not present

## 2019-04-09 DIAGNOSIS — N390 Urinary tract infection, site not specified: Secondary | ICD-10-CM | POA: Diagnosis not present

## 2019-04-09 NOTE — Telephone Encounter (Signed)
Called pt. Back and advised pt. Of her wound culture results. Pt. Stated understanding

## 2019-04-09 NOTE — Progress Notes (Signed)
Called Pt. Back and advised her about her wound culture results

## 2019-04-09 NOTE — Telephone Encounter (Signed)
-----   Message from Brooke Hudson, [place] sent at 04/09/2019 10:30 AM EDT ----- Will you let patient know that her wound culture is negative. She does not need any antibiotics by mouth at this time for her toe ulcer. Thanks Dr. Cannon Kettle

## 2019-04-10 DIAGNOSIS — E039 Hypothyroidism, unspecified: Secondary | ICD-10-CM | POA: Diagnosis not present

## 2019-04-10 DIAGNOSIS — I10 Essential (primary) hypertension: Secondary | ICD-10-CM | POA: Diagnosis not present

## 2019-04-10 DIAGNOSIS — E11621 Type 2 diabetes mellitus with foot ulcer: Secondary | ICD-10-CM | POA: Diagnosis not present

## 2019-04-10 DIAGNOSIS — Z955 Presence of coronary angioplasty implant and graft: Secondary | ICD-10-CM | POA: Diagnosis not present

## 2019-04-10 DIAGNOSIS — Z794 Long term (current) use of insulin: Secondary | ICD-10-CM | POA: Diagnosis not present

## 2019-04-10 DIAGNOSIS — I252 Old myocardial infarction: Secondary | ICD-10-CM | POA: Diagnosis not present

## 2019-04-10 DIAGNOSIS — Z792 Long term (current) use of antibiotics: Secondary | ICD-10-CM | POA: Diagnosis not present

## 2019-04-10 DIAGNOSIS — Z79899 Other long term (current) drug therapy: Secondary | ICD-10-CM | POA: Diagnosis not present

## 2019-04-10 DIAGNOSIS — H409 Unspecified glaucoma: Secondary | ICD-10-CM | POA: Diagnosis not present

## 2019-04-10 DIAGNOSIS — Z8673 Personal history of transient ischemic attack (TIA), and cerebral infarction without residual deficits: Secondary | ICD-10-CM | POA: Diagnosis not present

## 2019-04-10 DIAGNOSIS — I2510 Atherosclerotic heart disease of native coronary artery without angina pectoris: Secondary | ICD-10-CM | POA: Diagnosis not present

## 2019-04-10 DIAGNOSIS — E1140 Type 2 diabetes mellitus with diabetic neuropathy, unspecified: Secondary | ICD-10-CM | POA: Diagnosis not present

## 2019-04-10 DIAGNOSIS — N952 Postmenopausal atrophic vaginitis: Secondary | ICD-10-CM | POA: Diagnosis not present

## 2019-04-10 DIAGNOSIS — R2681 Unsteadiness on feet: Secondary | ICD-10-CM | POA: Diagnosis not present

## 2019-04-10 DIAGNOSIS — E7800 Pure hypercholesterolemia, unspecified: Secondary | ICD-10-CM | POA: Diagnosis not present

## 2019-04-10 DIAGNOSIS — L97519 Non-pressure chronic ulcer of other part of right foot with unspecified severity: Secondary | ICD-10-CM | POA: Diagnosis not present

## 2019-04-10 DIAGNOSIS — R339 Retention of urine, unspecified: Secondary | ICD-10-CM | POA: Diagnosis not present

## 2019-04-10 DIAGNOSIS — Z87440 Personal history of urinary (tract) infections: Secondary | ICD-10-CM | POA: Diagnosis not present

## 2019-04-10 DIAGNOSIS — Z9981 Dependence on supplemental oxygen: Secondary | ICD-10-CM | POA: Diagnosis not present

## 2019-04-10 DIAGNOSIS — J449 Chronic obstructive pulmonary disease, unspecified: Secondary | ICD-10-CM | POA: Diagnosis not present

## 2019-04-10 DIAGNOSIS — K219 Gastro-esophageal reflux disease without esophagitis: Secondary | ICD-10-CM | POA: Diagnosis not present

## 2019-04-10 DIAGNOSIS — N390 Urinary tract infection, site not specified: Secondary | ICD-10-CM | POA: Diagnosis not present

## 2019-04-11 DIAGNOSIS — Z87440 Personal history of urinary (tract) infections: Secondary | ICD-10-CM | POA: Diagnosis not present

## 2019-04-11 DIAGNOSIS — Z792 Long term (current) use of antibiotics: Secondary | ICD-10-CM | POA: Diagnosis not present

## 2019-04-11 DIAGNOSIS — E11621 Type 2 diabetes mellitus with foot ulcer: Secondary | ICD-10-CM | POA: Diagnosis not present

## 2019-04-11 DIAGNOSIS — I2510 Atherosclerotic heart disease of native coronary artery without angina pectoris: Secondary | ICD-10-CM | POA: Diagnosis not present

## 2019-04-11 DIAGNOSIS — L97519 Non-pressure chronic ulcer of other part of right foot with unspecified severity: Secondary | ICD-10-CM | POA: Diagnosis not present

## 2019-04-11 DIAGNOSIS — J449 Chronic obstructive pulmonary disease, unspecified: Secondary | ICD-10-CM | POA: Diagnosis not present

## 2019-04-11 DIAGNOSIS — R339 Retention of urine, unspecified: Secondary | ICD-10-CM | POA: Diagnosis not present

## 2019-04-11 DIAGNOSIS — E039 Hypothyroidism, unspecified: Secondary | ICD-10-CM | POA: Diagnosis not present

## 2019-04-11 DIAGNOSIS — Z79899 Other long term (current) drug therapy: Secondary | ICD-10-CM | POA: Diagnosis not present

## 2019-04-11 DIAGNOSIS — E1140 Type 2 diabetes mellitus with diabetic neuropathy, unspecified: Secondary | ICD-10-CM | POA: Diagnosis not present

## 2019-04-11 DIAGNOSIS — I10 Essential (primary) hypertension: Secondary | ICD-10-CM | POA: Diagnosis not present

## 2019-04-11 DIAGNOSIS — I252 Old myocardial infarction: Secondary | ICD-10-CM | POA: Diagnosis not present

## 2019-04-11 DIAGNOSIS — R2681 Unsteadiness on feet: Secondary | ICD-10-CM | POA: Diagnosis not present

## 2019-04-11 DIAGNOSIS — Z9981 Dependence on supplemental oxygen: Secondary | ICD-10-CM | POA: Diagnosis not present

## 2019-04-11 DIAGNOSIS — H409 Unspecified glaucoma: Secondary | ICD-10-CM | POA: Diagnosis not present

## 2019-04-11 DIAGNOSIS — Z955 Presence of coronary angioplasty implant and graft: Secondary | ICD-10-CM | POA: Diagnosis not present

## 2019-04-11 DIAGNOSIS — Z8673 Personal history of transient ischemic attack (TIA), and cerebral infarction without residual deficits: Secondary | ICD-10-CM | POA: Diagnosis not present

## 2019-04-11 DIAGNOSIS — E7800 Pure hypercholesterolemia, unspecified: Secondary | ICD-10-CM | POA: Diagnosis not present

## 2019-04-11 DIAGNOSIS — Z794 Long term (current) use of insulin: Secondary | ICD-10-CM | POA: Diagnosis not present

## 2019-04-11 DIAGNOSIS — N390 Urinary tract infection, site not specified: Secondary | ICD-10-CM | POA: Diagnosis not present

## 2019-04-11 DIAGNOSIS — N952 Postmenopausal atrophic vaginitis: Secondary | ICD-10-CM | POA: Diagnosis not present

## 2019-04-11 DIAGNOSIS — K219 Gastro-esophageal reflux disease without esophagitis: Secondary | ICD-10-CM | POA: Diagnosis not present

## 2019-04-12 DIAGNOSIS — R339 Retention of urine, unspecified: Secondary | ICD-10-CM | POA: Diagnosis not present

## 2019-04-12 DIAGNOSIS — L97519 Non-pressure chronic ulcer of other part of right foot with unspecified severity: Secondary | ICD-10-CM | POA: Diagnosis not present

## 2019-04-12 DIAGNOSIS — I10 Essential (primary) hypertension: Secondary | ICD-10-CM | POA: Diagnosis not present

## 2019-04-12 DIAGNOSIS — Z792 Long term (current) use of antibiotics: Secondary | ICD-10-CM | POA: Diagnosis not present

## 2019-04-12 DIAGNOSIS — Z794 Long term (current) use of insulin: Secondary | ICD-10-CM | POA: Diagnosis not present

## 2019-04-12 DIAGNOSIS — I252 Old myocardial infarction: Secondary | ICD-10-CM | POA: Diagnosis not present

## 2019-04-12 DIAGNOSIS — D500 Iron deficiency anemia secondary to blood loss (chronic): Secondary | ICD-10-CM | POA: Diagnosis not present

## 2019-04-12 DIAGNOSIS — E7800 Pure hypercholesterolemia, unspecified: Secondary | ICD-10-CM | POA: Diagnosis not present

## 2019-04-12 DIAGNOSIS — Z79899 Other long term (current) drug therapy: Secondary | ICD-10-CM | POA: Diagnosis not present

## 2019-04-12 DIAGNOSIS — E11621 Type 2 diabetes mellitus with foot ulcer: Secondary | ICD-10-CM | POA: Diagnosis not present

## 2019-04-12 DIAGNOSIS — Z955 Presence of coronary angioplasty implant and graft: Secondary | ICD-10-CM | POA: Diagnosis not present

## 2019-04-12 DIAGNOSIS — E1140 Type 2 diabetes mellitus with diabetic neuropathy, unspecified: Secondary | ICD-10-CM | POA: Diagnosis not present

## 2019-04-12 DIAGNOSIS — E039 Hypothyroidism, unspecified: Secondary | ICD-10-CM | POA: Diagnosis not present

## 2019-04-12 DIAGNOSIS — I2510 Atherosclerotic heart disease of native coronary artery without angina pectoris: Secondary | ICD-10-CM | POA: Diagnosis not present

## 2019-04-12 DIAGNOSIS — N952 Postmenopausal atrophic vaginitis: Secondary | ICD-10-CM | POA: Diagnosis not present

## 2019-04-12 DIAGNOSIS — N390 Urinary tract infection, site not specified: Secondary | ICD-10-CM | POA: Diagnosis not present

## 2019-04-12 DIAGNOSIS — Z87440 Personal history of urinary (tract) infections: Secondary | ICD-10-CM | POA: Diagnosis not present

## 2019-04-12 DIAGNOSIS — Z9981 Dependence on supplemental oxygen: Secondary | ICD-10-CM | POA: Diagnosis not present

## 2019-04-12 DIAGNOSIS — R195 Other fecal abnormalities: Secondary | ICD-10-CM | POA: Diagnosis not present

## 2019-04-12 DIAGNOSIS — H409 Unspecified glaucoma: Secondary | ICD-10-CM | POA: Diagnosis not present

## 2019-04-12 DIAGNOSIS — R2681 Unsteadiness on feet: Secondary | ICD-10-CM | POA: Diagnosis not present

## 2019-04-12 DIAGNOSIS — Z8673 Personal history of transient ischemic attack (TIA), and cerebral infarction without residual deficits: Secondary | ICD-10-CM | POA: Diagnosis not present

## 2019-04-12 DIAGNOSIS — J449 Chronic obstructive pulmonary disease, unspecified: Secondary | ICD-10-CM | POA: Diagnosis not present

## 2019-04-12 DIAGNOSIS — K219 Gastro-esophageal reflux disease without esophagitis: Secondary | ICD-10-CM | POA: Diagnosis not present

## 2019-04-13 DIAGNOSIS — E039 Hypothyroidism, unspecified: Secondary | ICD-10-CM | POA: Diagnosis not present

## 2019-04-13 DIAGNOSIS — I10 Essential (primary) hypertension: Secondary | ICD-10-CM | POA: Diagnosis not present

## 2019-04-13 DIAGNOSIS — H409 Unspecified glaucoma: Secondary | ICD-10-CM | POA: Diagnosis not present

## 2019-04-13 DIAGNOSIS — K219 Gastro-esophageal reflux disease without esophagitis: Secondary | ICD-10-CM | POA: Diagnosis not present

## 2019-04-13 DIAGNOSIS — J449 Chronic obstructive pulmonary disease, unspecified: Secondary | ICD-10-CM | POA: Diagnosis not present

## 2019-04-13 DIAGNOSIS — N952 Postmenopausal atrophic vaginitis: Secondary | ICD-10-CM | POA: Diagnosis not present

## 2019-04-13 DIAGNOSIS — L97519 Non-pressure chronic ulcer of other part of right foot with unspecified severity: Secondary | ICD-10-CM | POA: Diagnosis not present

## 2019-04-13 DIAGNOSIS — R339 Retention of urine, unspecified: Secondary | ICD-10-CM | POA: Diagnosis not present

## 2019-04-13 DIAGNOSIS — N390 Urinary tract infection, site not specified: Secondary | ICD-10-CM | POA: Diagnosis not present

## 2019-04-13 DIAGNOSIS — E7800 Pure hypercholesterolemia, unspecified: Secondary | ICD-10-CM | POA: Diagnosis not present

## 2019-04-13 DIAGNOSIS — E1140 Type 2 diabetes mellitus with diabetic neuropathy, unspecified: Secondary | ICD-10-CM | POA: Diagnosis not present

## 2019-04-13 DIAGNOSIS — Z79899 Other long term (current) drug therapy: Secondary | ICD-10-CM | POA: Diagnosis not present

## 2019-04-13 DIAGNOSIS — Z794 Long term (current) use of insulin: Secondary | ICD-10-CM | POA: Diagnosis not present

## 2019-04-13 DIAGNOSIS — I252 Old myocardial infarction: Secondary | ICD-10-CM | POA: Diagnosis not present

## 2019-04-13 DIAGNOSIS — Z9981 Dependence on supplemental oxygen: Secondary | ICD-10-CM | POA: Diagnosis not present

## 2019-04-13 DIAGNOSIS — R2681 Unsteadiness on feet: Secondary | ICD-10-CM | POA: Diagnosis not present

## 2019-04-13 DIAGNOSIS — Z87440 Personal history of urinary (tract) infections: Secondary | ICD-10-CM | POA: Diagnosis not present

## 2019-04-13 DIAGNOSIS — Z8673 Personal history of transient ischemic attack (TIA), and cerebral infarction without residual deficits: Secondary | ICD-10-CM | POA: Diagnosis not present

## 2019-04-13 DIAGNOSIS — Z792 Long term (current) use of antibiotics: Secondary | ICD-10-CM | POA: Diagnosis not present

## 2019-04-13 DIAGNOSIS — E11621 Type 2 diabetes mellitus with foot ulcer: Secondary | ICD-10-CM | POA: Diagnosis not present

## 2019-04-13 DIAGNOSIS — Z955 Presence of coronary angioplasty implant and graft: Secondary | ICD-10-CM | POA: Diagnosis not present

## 2019-04-13 DIAGNOSIS — I2510 Atherosclerotic heart disease of native coronary artery without angina pectoris: Secondary | ICD-10-CM | POA: Diagnosis not present

## 2019-04-16 DIAGNOSIS — I509 Heart failure, unspecified: Secondary | ICD-10-CM | POA: Diagnosis not present

## 2019-04-16 DIAGNOSIS — R0902 Hypoxemia: Secondary | ICD-10-CM | POA: Diagnosis not present

## 2019-04-16 DIAGNOSIS — J449 Chronic obstructive pulmonary disease, unspecified: Secondary | ICD-10-CM | POA: Diagnosis not present

## 2019-04-17 DIAGNOSIS — I2510 Atherosclerotic heart disease of native coronary artery without angina pectoris: Secondary | ICD-10-CM | POA: Diagnosis not present

## 2019-04-17 DIAGNOSIS — Z794 Long term (current) use of insulin: Secondary | ICD-10-CM | POA: Diagnosis not present

## 2019-04-17 DIAGNOSIS — I10 Essential (primary) hypertension: Secondary | ICD-10-CM | POA: Diagnosis not present

## 2019-04-17 DIAGNOSIS — Z792 Long term (current) use of antibiotics: Secondary | ICD-10-CM | POA: Diagnosis not present

## 2019-04-17 DIAGNOSIS — E11621 Type 2 diabetes mellitus with foot ulcer: Secondary | ICD-10-CM | POA: Diagnosis not present

## 2019-04-17 DIAGNOSIS — E7800 Pure hypercholesterolemia, unspecified: Secondary | ICD-10-CM | POA: Diagnosis not present

## 2019-04-17 DIAGNOSIS — N390 Urinary tract infection, site not specified: Secondary | ICD-10-CM | POA: Diagnosis not present

## 2019-04-17 DIAGNOSIS — Z79899 Other long term (current) drug therapy: Secondary | ICD-10-CM | POA: Diagnosis not present

## 2019-04-17 DIAGNOSIS — K219 Gastro-esophageal reflux disease without esophagitis: Secondary | ICD-10-CM | POA: Diagnosis not present

## 2019-04-17 DIAGNOSIS — Z8673 Personal history of transient ischemic attack (TIA), and cerebral infarction without residual deficits: Secondary | ICD-10-CM | POA: Diagnosis not present

## 2019-04-17 DIAGNOSIS — R2681 Unsteadiness on feet: Secondary | ICD-10-CM | POA: Diagnosis not present

## 2019-04-17 DIAGNOSIS — N952 Postmenopausal atrophic vaginitis: Secondary | ICD-10-CM | POA: Diagnosis not present

## 2019-04-17 DIAGNOSIS — R339 Retention of urine, unspecified: Secondary | ICD-10-CM | POA: Diagnosis not present

## 2019-04-17 DIAGNOSIS — Z955 Presence of coronary angioplasty implant and graft: Secondary | ICD-10-CM | POA: Diagnosis not present

## 2019-04-17 DIAGNOSIS — E1140 Type 2 diabetes mellitus with diabetic neuropathy, unspecified: Secondary | ICD-10-CM | POA: Diagnosis not present

## 2019-04-17 DIAGNOSIS — J449 Chronic obstructive pulmonary disease, unspecified: Secondary | ICD-10-CM | POA: Diagnosis not present

## 2019-04-17 DIAGNOSIS — Z87440 Personal history of urinary (tract) infections: Secondary | ICD-10-CM | POA: Diagnosis not present

## 2019-04-17 DIAGNOSIS — H409 Unspecified glaucoma: Secondary | ICD-10-CM | POA: Diagnosis not present

## 2019-04-17 DIAGNOSIS — I252 Old myocardial infarction: Secondary | ICD-10-CM | POA: Diagnosis not present

## 2019-04-17 DIAGNOSIS — E039 Hypothyroidism, unspecified: Secondary | ICD-10-CM | POA: Diagnosis not present

## 2019-04-17 DIAGNOSIS — L97519 Non-pressure chronic ulcer of other part of right foot with unspecified severity: Secondary | ICD-10-CM | POA: Diagnosis not present

## 2019-04-17 DIAGNOSIS — Z9981 Dependence on supplemental oxygen: Secondary | ICD-10-CM | POA: Diagnosis not present

## 2019-04-20 ENCOUNTER — Ambulatory Visit: Payer: Medicare Other | Admitting: Sports Medicine

## 2019-04-24 DIAGNOSIS — E7800 Pure hypercholesterolemia, unspecified: Secondary | ICD-10-CM | POA: Diagnosis not present

## 2019-04-24 DIAGNOSIS — Z955 Presence of coronary angioplasty implant and graft: Secondary | ICD-10-CM | POA: Diagnosis not present

## 2019-04-24 DIAGNOSIS — E039 Hypothyroidism, unspecified: Secondary | ICD-10-CM | POA: Diagnosis not present

## 2019-04-24 DIAGNOSIS — Z794 Long term (current) use of insulin: Secondary | ICD-10-CM | POA: Diagnosis not present

## 2019-04-24 DIAGNOSIS — N952 Postmenopausal atrophic vaginitis: Secondary | ICD-10-CM | POA: Diagnosis not present

## 2019-04-24 DIAGNOSIS — Z9981 Dependence on supplemental oxygen: Secondary | ICD-10-CM | POA: Diagnosis not present

## 2019-04-24 DIAGNOSIS — Z8673 Personal history of transient ischemic attack (TIA), and cerebral infarction without residual deficits: Secondary | ICD-10-CM | POA: Diagnosis not present

## 2019-04-24 DIAGNOSIS — Z79899 Other long term (current) drug therapy: Secondary | ICD-10-CM | POA: Diagnosis not present

## 2019-04-24 DIAGNOSIS — R339 Retention of urine, unspecified: Secondary | ICD-10-CM | POA: Diagnosis not present

## 2019-04-24 DIAGNOSIS — J449 Chronic obstructive pulmonary disease, unspecified: Secondary | ICD-10-CM | POA: Diagnosis not present

## 2019-04-24 DIAGNOSIS — E1140 Type 2 diabetes mellitus with diabetic neuropathy, unspecified: Secondary | ICD-10-CM | POA: Diagnosis not present

## 2019-04-24 DIAGNOSIS — L97519 Non-pressure chronic ulcer of other part of right foot with unspecified severity: Secondary | ICD-10-CM | POA: Diagnosis not present

## 2019-04-24 DIAGNOSIS — I2510 Atherosclerotic heart disease of native coronary artery without angina pectoris: Secondary | ICD-10-CM | POA: Diagnosis not present

## 2019-04-24 DIAGNOSIS — R2681 Unsteadiness on feet: Secondary | ICD-10-CM | POA: Diagnosis not present

## 2019-04-24 DIAGNOSIS — K219 Gastro-esophageal reflux disease without esophagitis: Secondary | ICD-10-CM | POA: Diagnosis not present

## 2019-04-24 DIAGNOSIS — H409 Unspecified glaucoma: Secondary | ICD-10-CM | POA: Diagnosis not present

## 2019-04-24 DIAGNOSIS — Z87440 Personal history of urinary (tract) infections: Secondary | ICD-10-CM | POA: Diagnosis not present

## 2019-04-24 DIAGNOSIS — E11621 Type 2 diabetes mellitus with foot ulcer: Secondary | ICD-10-CM | POA: Diagnosis not present

## 2019-04-24 DIAGNOSIS — N390 Urinary tract infection, site not specified: Secondary | ICD-10-CM | POA: Diagnosis not present

## 2019-04-24 DIAGNOSIS — I252 Old myocardial infarction: Secondary | ICD-10-CM | POA: Diagnosis not present

## 2019-04-24 DIAGNOSIS — I10 Essential (primary) hypertension: Secondary | ICD-10-CM | POA: Diagnosis not present

## 2019-04-24 DIAGNOSIS — Z792 Long term (current) use of antibiotics: Secondary | ICD-10-CM | POA: Diagnosis not present

## 2019-04-27 ENCOUNTER — Ambulatory Visit: Payer: Medicare Other | Admitting: Sports Medicine

## 2019-04-30 DIAGNOSIS — J449 Chronic obstructive pulmonary disease, unspecified: Secondary | ICD-10-CM | POA: Diagnosis not present

## 2019-04-30 DIAGNOSIS — R339 Retention of urine, unspecified: Secondary | ICD-10-CM | POA: Diagnosis not present

## 2019-04-30 DIAGNOSIS — N390 Urinary tract infection, site not specified: Secondary | ICD-10-CM | POA: Diagnosis not present

## 2019-05-04 DIAGNOSIS — I509 Heart failure, unspecified: Secondary | ICD-10-CM | POA: Diagnosis not present

## 2019-05-04 DIAGNOSIS — R0902 Hypoxemia: Secondary | ICD-10-CM | POA: Diagnosis not present

## 2019-05-04 DIAGNOSIS — J449 Chronic obstructive pulmonary disease, unspecified: Secondary | ICD-10-CM | POA: Diagnosis not present

## 2019-05-09 DIAGNOSIS — Z0000 Encounter for general adult medical examination without abnormal findings: Secondary | ICD-10-CM | POA: Diagnosis not present

## 2019-05-09 DIAGNOSIS — Z9181 History of falling: Secondary | ICD-10-CM | POA: Diagnosis not present

## 2019-05-09 DIAGNOSIS — E785 Hyperlipidemia, unspecified: Secondary | ICD-10-CM | POA: Diagnosis not present

## 2019-05-09 DIAGNOSIS — Z1231 Encounter for screening mammogram for malignant neoplasm of breast: Secondary | ICD-10-CM | POA: Diagnosis not present

## 2019-05-09 DIAGNOSIS — Z1211 Encounter for screening for malignant neoplasm of colon: Secondary | ICD-10-CM | POA: Diagnosis not present

## 2019-05-10 DIAGNOSIS — N952 Postmenopausal atrophic vaginitis: Secondary | ICD-10-CM | POA: Diagnosis not present

## 2019-05-10 DIAGNOSIS — I10 Essential (primary) hypertension: Secondary | ICD-10-CM | POA: Diagnosis not present

## 2019-05-10 DIAGNOSIS — L97519 Non-pressure chronic ulcer of other part of right foot with unspecified severity: Secondary | ICD-10-CM | POA: Diagnosis not present

## 2019-05-10 DIAGNOSIS — K219 Gastro-esophageal reflux disease without esophagitis: Secondary | ICD-10-CM | POA: Diagnosis not present

## 2019-05-10 DIAGNOSIS — Z955 Presence of coronary angioplasty implant and graft: Secondary | ICD-10-CM | POA: Diagnosis not present

## 2019-05-10 DIAGNOSIS — E7800 Pure hypercholesterolemia, unspecified: Secondary | ICD-10-CM | POA: Diagnosis not present

## 2019-05-10 DIAGNOSIS — Z8673 Personal history of transient ischemic attack (TIA), and cerebral infarction without residual deficits: Secondary | ICD-10-CM | POA: Diagnosis not present

## 2019-05-10 DIAGNOSIS — E11621 Type 2 diabetes mellitus with foot ulcer: Secondary | ICD-10-CM | POA: Diagnosis not present

## 2019-05-10 DIAGNOSIS — Z9981 Dependence on supplemental oxygen: Secondary | ICD-10-CM | POA: Diagnosis not present

## 2019-05-10 DIAGNOSIS — E1140 Type 2 diabetes mellitus with diabetic neuropathy, unspecified: Secondary | ICD-10-CM | POA: Diagnosis not present

## 2019-05-10 DIAGNOSIS — J449 Chronic obstructive pulmonary disease, unspecified: Secondary | ICD-10-CM | POA: Diagnosis not present

## 2019-05-10 DIAGNOSIS — Z794 Long term (current) use of insulin: Secondary | ICD-10-CM | POA: Diagnosis not present

## 2019-05-10 DIAGNOSIS — I2510 Atherosclerotic heart disease of native coronary artery without angina pectoris: Secondary | ICD-10-CM | POA: Diagnosis not present

## 2019-05-10 DIAGNOSIS — E039 Hypothyroidism, unspecified: Secondary | ICD-10-CM | POA: Diagnosis not present

## 2019-05-10 DIAGNOSIS — I252 Old myocardial infarction: Secondary | ICD-10-CM | POA: Diagnosis not present

## 2019-05-10 DIAGNOSIS — R339 Retention of urine, unspecified: Secondary | ICD-10-CM | POA: Diagnosis not present

## 2019-05-10 DIAGNOSIS — R2681 Unsteadiness on feet: Secondary | ICD-10-CM | POA: Diagnosis not present

## 2019-05-10 DIAGNOSIS — N390 Urinary tract infection, site not specified: Secondary | ICD-10-CM | POA: Diagnosis not present

## 2019-05-10 DIAGNOSIS — Z79899 Other long term (current) drug therapy: Secondary | ICD-10-CM | POA: Diagnosis not present

## 2019-05-10 DIAGNOSIS — Z792 Long term (current) use of antibiotics: Secondary | ICD-10-CM | POA: Diagnosis not present

## 2019-05-10 DIAGNOSIS — Z87440 Personal history of urinary (tract) infections: Secondary | ICD-10-CM | POA: Diagnosis not present

## 2019-05-10 DIAGNOSIS — H409 Unspecified glaucoma: Secondary | ICD-10-CM | POA: Diagnosis not present

## 2019-05-12 DIAGNOSIS — J449 Chronic obstructive pulmonary disease, unspecified: Secondary | ICD-10-CM | POA: Diagnosis not present

## 2019-05-17 DIAGNOSIS — J449 Chronic obstructive pulmonary disease, unspecified: Secondary | ICD-10-CM | POA: Diagnosis not present

## 2019-05-17 DIAGNOSIS — I509 Heart failure, unspecified: Secondary | ICD-10-CM | POA: Diagnosis not present

## 2019-05-17 DIAGNOSIS — R0902 Hypoxemia: Secondary | ICD-10-CM | POA: Diagnosis not present

## 2019-05-23 DIAGNOSIS — R339 Retention of urine, unspecified: Secondary | ICD-10-CM | POA: Diagnosis not present

## 2019-05-23 DIAGNOSIS — N390 Urinary tract infection, site not specified: Secondary | ICD-10-CM | POA: Diagnosis not present

## 2019-05-24 ENCOUNTER — Other Ambulatory Visit: Payer: Self-pay

## 2019-05-24 ENCOUNTER — Other Ambulatory Visit: Payer: Self-pay | Admitting: Cardiology

## 2019-05-24 MED ORDER — CLOPIDOGREL BISULFATE 75 MG PO TABS: 75.0000 mg | ORAL_TABLET | Freq: Every day | ORAL | 0 refills | Status: DC

## 2019-05-30 DIAGNOSIS — K635 Polyp of colon: Secondary | ICD-10-CM | POA: Diagnosis not present

## 2019-05-30 DIAGNOSIS — D649 Anemia, unspecified: Secondary | ICD-10-CM | POA: Diagnosis not present

## 2019-05-30 DIAGNOSIS — D126 Benign neoplasm of colon, unspecified: Secondary | ICD-10-CM | POA: Diagnosis not present

## 2019-05-30 DIAGNOSIS — J449 Chronic obstructive pulmonary disease, unspecified: Secondary | ICD-10-CM | POA: Diagnosis not present

## 2019-05-30 DIAGNOSIS — R195 Other fecal abnormalities: Secondary | ICD-10-CM | POA: Diagnosis not present

## 2019-06-03 DIAGNOSIS — I509 Heart failure, unspecified: Secondary | ICD-10-CM | POA: Diagnosis not present

## 2019-06-03 DIAGNOSIS — J449 Chronic obstructive pulmonary disease, unspecified: Secondary | ICD-10-CM | POA: Diagnosis not present

## 2019-06-03 DIAGNOSIS — R0902 Hypoxemia: Secondary | ICD-10-CM | POA: Diagnosis not present

## 2019-06-11 DIAGNOSIS — J449 Chronic obstructive pulmonary disease, unspecified: Secondary | ICD-10-CM | POA: Diagnosis not present

## 2019-06-16 DIAGNOSIS — J449 Chronic obstructive pulmonary disease, unspecified: Secondary | ICD-10-CM | POA: Diagnosis not present

## 2019-06-16 DIAGNOSIS — R0902 Hypoxemia: Secondary | ICD-10-CM | POA: Diagnosis not present

## 2019-06-16 DIAGNOSIS — I509 Heart failure, unspecified: Secondary | ICD-10-CM | POA: Diagnosis not present

## 2019-06-20 DIAGNOSIS — E785 Hyperlipidemia, unspecified: Secondary | ICD-10-CM | POA: Diagnosis not present

## 2019-06-20 DIAGNOSIS — I10 Essential (primary) hypertension: Secondary | ICD-10-CM | POA: Diagnosis not present

## 2019-06-20 DIAGNOSIS — R300 Dysuria: Secondary | ICD-10-CM | POA: Diagnosis not present

## 2019-06-20 DIAGNOSIS — E039 Hypothyroidism, unspecified: Secondary | ICD-10-CM | POA: Diagnosis not present

## 2019-06-20 DIAGNOSIS — E1140 Type 2 diabetes mellitus with diabetic neuropathy, unspecified: Secondary | ICD-10-CM | POA: Diagnosis not present

## 2019-06-20 DIAGNOSIS — Z87440 Personal history of urinary (tract) infections: Secondary | ICD-10-CM | POA: Diagnosis not present

## 2019-06-20 DIAGNOSIS — D649 Anemia, unspecified: Secondary | ICD-10-CM | POA: Diagnosis not present

## 2019-06-20 DIAGNOSIS — Z23 Encounter for immunization: Secondary | ICD-10-CM | POA: Diagnosis not present

## 2019-06-26 DIAGNOSIS — Z79899 Other long term (current) drug therapy: Secondary | ICD-10-CM | POA: Diagnosis not present

## 2019-06-26 DIAGNOSIS — I110 Hypertensive heart disease with heart failure: Secondary | ICD-10-CM | POA: Diagnosis not present

## 2019-06-26 DIAGNOSIS — M1990 Unspecified osteoarthritis, unspecified site: Secondary | ICD-10-CM | POA: Diagnosis not present

## 2019-06-26 DIAGNOSIS — M79604 Pain in right leg: Secondary | ICD-10-CM | POA: Diagnosis not present

## 2019-06-26 DIAGNOSIS — M7989 Other specified soft tissue disorders: Secondary | ICD-10-CM | POA: Diagnosis not present

## 2019-06-26 DIAGNOSIS — Z794 Long term (current) use of insulin: Secondary | ICD-10-CM | POA: Diagnosis not present

## 2019-06-26 DIAGNOSIS — H548 Legal blindness, as defined in USA: Secondary | ICD-10-CM | POA: Diagnosis not present

## 2019-06-26 DIAGNOSIS — I700 Atherosclerosis of aorta: Secondary | ICD-10-CM | POA: Diagnosis not present

## 2019-06-26 DIAGNOSIS — Z03818 Encounter for observation for suspected exposure to other biological agents ruled out: Secondary | ICD-10-CM | POA: Diagnosis not present

## 2019-06-26 DIAGNOSIS — S80811A Abrasion, right lower leg, initial encounter: Secondary | ICD-10-CM | POA: Diagnosis not present

## 2019-06-26 DIAGNOSIS — I889 Nonspecific lymphadenitis, unspecified: Secondary | ICD-10-CM | POA: Diagnosis not present

## 2019-06-26 DIAGNOSIS — E039 Hypothyroidism, unspecified: Secondary | ICD-10-CM | POA: Diagnosis not present

## 2019-06-26 DIAGNOSIS — I2510 Atherosclerotic heart disease of native coronary artery without angina pectoris: Secondary | ICD-10-CM | POA: Diagnosis not present

## 2019-06-26 DIAGNOSIS — Z8673 Personal history of transient ischemic attack (TIA), and cerebral infarction without residual deficits: Secondary | ICD-10-CM | POA: Diagnosis not present

## 2019-06-26 DIAGNOSIS — L03115 Cellulitis of right lower limb: Secondary | ICD-10-CM | POA: Diagnosis not present

## 2019-06-26 DIAGNOSIS — Z881 Allergy status to other antibiotic agents status: Secondary | ICD-10-CM | POA: Diagnosis not present

## 2019-06-26 DIAGNOSIS — M79606 Pain in leg, unspecified: Secondary | ICD-10-CM | POA: Diagnosis not present

## 2019-06-26 DIAGNOSIS — J449 Chronic obstructive pulmonary disease, unspecified: Secondary | ICD-10-CM | POA: Diagnosis not present

## 2019-06-26 DIAGNOSIS — I5032 Chronic diastolic (congestive) heart failure: Secondary | ICD-10-CM | POA: Diagnosis not present

## 2019-06-26 DIAGNOSIS — E1151 Type 2 diabetes mellitus with diabetic peripheral angiopathy without gangrene: Secondary | ICD-10-CM | POA: Diagnosis not present

## 2019-06-26 DIAGNOSIS — Z9981 Dependence on supplemental oxygen: Secondary | ICD-10-CM | POA: Diagnosis not present

## 2019-06-26 DIAGNOSIS — K219 Gastro-esophageal reflux disease without esophagitis: Secondary | ICD-10-CM | POA: Diagnosis not present

## 2019-06-26 DIAGNOSIS — Z7982 Long term (current) use of aspirin: Secondary | ICD-10-CM | POA: Diagnosis not present

## 2019-06-26 DIAGNOSIS — I891 Lymphangitis: Secondary | ICD-10-CM | POA: Diagnosis not present

## 2019-06-26 DIAGNOSIS — D509 Iron deficiency anemia, unspecified: Secondary | ICD-10-CM | POA: Diagnosis not present

## 2019-06-26 DIAGNOSIS — D513 Other dietary vitamin B12 deficiency anemia: Secondary | ICD-10-CM | POA: Diagnosis not present

## 2019-06-26 DIAGNOSIS — I252 Old myocardial infarction: Secondary | ICD-10-CM | POA: Diagnosis not present

## 2019-06-26 DIAGNOSIS — E1165 Type 2 diabetes mellitus with hyperglycemia: Secondary | ICD-10-CM | POA: Diagnosis not present

## 2019-06-27 DIAGNOSIS — Z794 Long term (current) use of insulin: Secondary | ICD-10-CM | POA: Diagnosis not present

## 2019-06-27 DIAGNOSIS — I10 Essential (primary) hypertension: Secondary | ICD-10-CM

## 2019-06-27 DIAGNOSIS — Z881 Allergy status to other antibiotic agents status: Secondary | ICD-10-CM | POA: Diagnosis not present

## 2019-06-27 DIAGNOSIS — Z9981 Dependence on supplemental oxygen: Secondary | ICD-10-CM | POA: Diagnosis not present

## 2019-06-27 DIAGNOSIS — Z720 Tobacco use: Secondary | ICD-10-CM

## 2019-06-27 DIAGNOSIS — K219 Gastro-esophageal reflux disease without esophagitis: Secondary | ICD-10-CM | POA: Diagnosis not present

## 2019-06-27 DIAGNOSIS — I5032 Chronic diastolic (congestive) heart failure: Secondary | ICD-10-CM | POA: Diagnosis not present

## 2019-06-27 DIAGNOSIS — D513 Other dietary vitamin B12 deficiency anemia: Secondary | ICD-10-CM | POA: Diagnosis not present

## 2019-06-27 DIAGNOSIS — L03115 Cellulitis of right lower limb: Secondary | ICD-10-CM | POA: Diagnosis not present

## 2019-06-27 DIAGNOSIS — M79606 Pain in leg, unspecified: Secondary | ICD-10-CM | POA: Diagnosis not present

## 2019-06-27 DIAGNOSIS — S80811A Abrasion, right lower leg, initial encounter: Secondary | ICD-10-CM | POA: Diagnosis not present

## 2019-06-27 DIAGNOSIS — J449 Chronic obstructive pulmonary disease, unspecified: Secondary | ICD-10-CM | POA: Diagnosis not present

## 2019-06-27 DIAGNOSIS — E1165 Type 2 diabetes mellitus with hyperglycemia: Secondary | ICD-10-CM | POA: Diagnosis not present

## 2019-06-27 DIAGNOSIS — I2510 Atherosclerotic heart disease of native coronary artery without angina pectoris: Secondary | ICD-10-CM | POA: Diagnosis not present

## 2019-06-27 DIAGNOSIS — Z7982 Long term (current) use of aspirin: Secondary | ICD-10-CM | POA: Diagnosis not present

## 2019-06-27 DIAGNOSIS — E039 Hypothyroidism, unspecified: Secondary | ICD-10-CM | POA: Diagnosis not present

## 2019-06-27 DIAGNOSIS — R600 Localized edema: Secondary | ICD-10-CM | POA: Diagnosis not present

## 2019-06-27 DIAGNOSIS — H548 Legal blindness, as defined in USA: Secondary | ICD-10-CM | POA: Diagnosis not present

## 2019-06-27 DIAGNOSIS — E1151 Type 2 diabetes mellitus with diabetic peripheral angiopathy without gangrene: Secondary | ICD-10-CM | POA: Diagnosis not present

## 2019-06-27 DIAGNOSIS — Z79899 Other long term (current) drug therapy: Secondary | ICD-10-CM | POA: Diagnosis not present

## 2019-06-27 DIAGNOSIS — E119 Type 2 diabetes mellitus without complications: Secondary | ICD-10-CM | POA: Diagnosis not present

## 2019-06-27 DIAGNOSIS — D509 Iron deficiency anemia, unspecified: Secondary | ICD-10-CM | POA: Diagnosis not present

## 2019-06-27 DIAGNOSIS — I639 Cerebral infarction, unspecified: Secondary | ICD-10-CM | POA: Diagnosis not present

## 2019-06-27 DIAGNOSIS — Z8673 Personal history of transient ischemic attack (TIA), and cerebral infarction without residual deficits: Secondary | ICD-10-CM | POA: Diagnosis not present

## 2019-06-27 DIAGNOSIS — I110 Hypertensive heart disease with heart failure: Secondary | ICD-10-CM | POA: Diagnosis not present

## 2019-06-27 DIAGNOSIS — I889 Nonspecific lymphadenitis, unspecified: Secondary | ICD-10-CM | POA: Diagnosis not present

## 2019-06-27 DIAGNOSIS — L03119 Cellulitis of unspecified part of limb: Secondary | ICD-10-CM | POA: Diagnosis not present

## 2019-06-27 DIAGNOSIS — I252 Old myocardial infarction: Secondary | ICD-10-CM | POA: Diagnosis not present

## 2019-06-27 DIAGNOSIS — I891 Lymphangitis: Secondary | ICD-10-CM | POA: Diagnosis not present

## 2019-06-27 DIAGNOSIS — M1990 Unspecified osteoarthritis, unspecified site: Secondary | ICD-10-CM | POA: Diagnosis not present

## 2019-06-28 DIAGNOSIS — E039 Hypothyroidism, unspecified: Secondary | ICD-10-CM | POA: Diagnosis not present

## 2019-06-28 DIAGNOSIS — I110 Hypertensive heart disease with heart failure: Secondary | ICD-10-CM | POA: Diagnosis not present

## 2019-06-28 DIAGNOSIS — D513 Other dietary vitamin B12 deficiency anemia: Secondary | ICD-10-CM | POA: Diagnosis not present

## 2019-06-28 DIAGNOSIS — K219 Gastro-esophageal reflux disease without esophagitis: Secondary | ICD-10-CM | POA: Diagnosis not present

## 2019-06-28 DIAGNOSIS — E1165 Type 2 diabetes mellitus with hyperglycemia: Secondary | ICD-10-CM | POA: Diagnosis not present

## 2019-06-28 DIAGNOSIS — E1151 Type 2 diabetes mellitus with diabetic peripheral angiopathy without gangrene: Secondary | ICD-10-CM | POA: Diagnosis not present

## 2019-06-28 DIAGNOSIS — H548 Legal blindness, as defined in USA: Secondary | ICD-10-CM | POA: Diagnosis not present

## 2019-06-28 DIAGNOSIS — Z794 Long term (current) use of insulin: Secondary | ICD-10-CM | POA: Diagnosis not present

## 2019-06-28 DIAGNOSIS — J449 Chronic obstructive pulmonary disease, unspecified: Secondary | ICD-10-CM | POA: Diagnosis not present

## 2019-06-28 DIAGNOSIS — Z8673 Personal history of transient ischemic attack (TIA), and cerebral infarction without residual deficits: Secondary | ICD-10-CM | POA: Diagnosis not present

## 2019-06-28 DIAGNOSIS — Z881 Allergy status to other antibiotic agents status: Secondary | ICD-10-CM | POA: Diagnosis not present

## 2019-06-28 DIAGNOSIS — M1990 Unspecified osteoarthritis, unspecified site: Secondary | ICD-10-CM | POA: Diagnosis not present

## 2019-06-28 DIAGNOSIS — I5032 Chronic diastolic (congestive) heart failure: Secondary | ICD-10-CM | POA: Diagnosis not present

## 2019-06-28 DIAGNOSIS — I639 Cerebral infarction, unspecified: Secondary | ICD-10-CM | POA: Diagnosis not present

## 2019-06-28 DIAGNOSIS — I252 Old myocardial infarction: Secondary | ICD-10-CM | POA: Diagnosis not present

## 2019-06-28 DIAGNOSIS — S80811A Abrasion, right lower leg, initial encounter: Secondary | ICD-10-CM | POA: Diagnosis not present

## 2019-06-28 DIAGNOSIS — I2510 Atherosclerotic heart disease of native coronary artery without angina pectoris: Secondary | ICD-10-CM | POA: Diagnosis not present

## 2019-06-28 DIAGNOSIS — I889 Nonspecific lymphadenitis, unspecified: Secondary | ICD-10-CM | POA: Diagnosis not present

## 2019-06-28 DIAGNOSIS — I891 Lymphangitis: Secondary | ICD-10-CM | POA: Diagnosis not present

## 2019-06-28 DIAGNOSIS — M79606 Pain in leg, unspecified: Secondary | ICD-10-CM | POA: Diagnosis not present

## 2019-06-28 DIAGNOSIS — L03115 Cellulitis of right lower limb: Secondary | ICD-10-CM | POA: Diagnosis not present

## 2019-06-28 DIAGNOSIS — D509 Iron deficiency anemia, unspecified: Secondary | ICD-10-CM | POA: Diagnosis not present

## 2019-06-28 DIAGNOSIS — Z9981 Dependence on supplemental oxygen: Secondary | ICD-10-CM | POA: Diagnosis not present

## 2019-06-28 DIAGNOSIS — Z7982 Long term (current) use of aspirin: Secondary | ICD-10-CM | POA: Diagnosis not present

## 2019-06-28 DIAGNOSIS — Z79899 Other long term (current) drug therapy: Secondary | ICD-10-CM | POA: Diagnosis not present

## 2019-06-28 DIAGNOSIS — L03119 Cellulitis of unspecified part of limb: Secondary | ICD-10-CM | POA: Diagnosis not present

## 2019-06-29 DIAGNOSIS — I252 Old myocardial infarction: Secondary | ICD-10-CM | POA: Diagnosis not present

## 2019-06-29 DIAGNOSIS — I889 Nonspecific lymphadenitis, unspecified: Secondary | ICD-10-CM | POA: Diagnosis not present

## 2019-06-29 DIAGNOSIS — Z7982 Long term (current) use of aspirin: Secondary | ICD-10-CM | POA: Diagnosis not present

## 2019-06-29 DIAGNOSIS — M1990 Unspecified osteoarthritis, unspecified site: Secondary | ICD-10-CM | POA: Diagnosis not present

## 2019-06-29 DIAGNOSIS — I5032 Chronic diastolic (congestive) heart failure: Secondary | ICD-10-CM | POA: Diagnosis not present

## 2019-06-29 DIAGNOSIS — I891 Lymphangitis: Secondary | ICD-10-CM | POA: Diagnosis not present

## 2019-06-29 DIAGNOSIS — D509 Iron deficiency anemia, unspecified: Secondary | ICD-10-CM | POA: Diagnosis not present

## 2019-06-29 DIAGNOSIS — L03115 Cellulitis of right lower limb: Secondary | ICD-10-CM | POA: Diagnosis not present

## 2019-06-29 DIAGNOSIS — D513 Other dietary vitamin B12 deficiency anemia: Secondary | ICD-10-CM | POA: Diagnosis not present

## 2019-06-29 DIAGNOSIS — Z8673 Personal history of transient ischemic attack (TIA), and cerebral infarction without residual deficits: Secondary | ICD-10-CM | POA: Diagnosis not present

## 2019-06-29 DIAGNOSIS — J449 Chronic obstructive pulmonary disease, unspecified: Secondary | ICD-10-CM | POA: Diagnosis not present

## 2019-06-29 DIAGNOSIS — E1151 Type 2 diabetes mellitus with diabetic peripheral angiopathy without gangrene: Secondary | ICD-10-CM | POA: Diagnosis not present

## 2019-06-29 DIAGNOSIS — Z9981 Dependence on supplemental oxygen: Secondary | ICD-10-CM | POA: Diagnosis not present

## 2019-06-29 DIAGNOSIS — S80811A Abrasion, right lower leg, initial encounter: Secondary | ICD-10-CM | POA: Diagnosis not present

## 2019-06-29 DIAGNOSIS — H548 Legal blindness, as defined in USA: Secondary | ICD-10-CM | POA: Diagnosis not present

## 2019-06-29 DIAGNOSIS — I110 Hypertensive heart disease with heart failure: Secondary | ICD-10-CM | POA: Diagnosis not present

## 2019-06-29 DIAGNOSIS — L03119 Cellulitis of unspecified part of limb: Secondary | ICD-10-CM | POA: Diagnosis not present

## 2019-06-29 DIAGNOSIS — Z794 Long term (current) use of insulin: Secondary | ICD-10-CM | POA: Diagnosis not present

## 2019-06-29 DIAGNOSIS — Z881 Allergy status to other antibiotic agents status: Secondary | ICD-10-CM | POA: Diagnosis not present

## 2019-06-29 DIAGNOSIS — K219 Gastro-esophageal reflux disease without esophagitis: Secondary | ICD-10-CM | POA: Diagnosis not present

## 2019-06-29 DIAGNOSIS — I639 Cerebral infarction, unspecified: Secondary | ICD-10-CM | POA: Diagnosis not present

## 2019-06-29 DIAGNOSIS — E1165 Type 2 diabetes mellitus with hyperglycemia: Secondary | ICD-10-CM | POA: Diagnosis not present

## 2019-06-29 DIAGNOSIS — E039 Hypothyroidism, unspecified: Secondary | ICD-10-CM | POA: Diagnosis not present

## 2019-06-29 DIAGNOSIS — Z79899 Other long term (current) drug therapy: Secondary | ICD-10-CM | POA: Diagnosis not present

## 2019-06-29 DIAGNOSIS — M79606 Pain in leg, unspecified: Secondary | ICD-10-CM | POA: Diagnosis not present

## 2019-06-29 DIAGNOSIS — I2510 Atherosclerotic heart disease of native coronary artery without angina pectoris: Secondary | ICD-10-CM | POA: Diagnosis not present

## 2019-06-30 DIAGNOSIS — H548 Legal blindness, as defined in USA: Secondary | ICD-10-CM | POA: Diagnosis not present

## 2019-06-30 DIAGNOSIS — I889 Nonspecific lymphadenitis, unspecified: Secondary | ICD-10-CM | POA: Diagnosis not present

## 2019-06-30 DIAGNOSIS — I2510 Atherosclerotic heart disease of native coronary artery without angina pectoris: Secondary | ICD-10-CM | POA: Diagnosis not present

## 2019-06-30 DIAGNOSIS — D509 Iron deficiency anemia, unspecified: Secondary | ICD-10-CM | POA: Diagnosis not present

## 2019-06-30 DIAGNOSIS — Z7982 Long term (current) use of aspirin: Secondary | ICD-10-CM | POA: Diagnosis not present

## 2019-06-30 DIAGNOSIS — I252 Old myocardial infarction: Secondary | ICD-10-CM | POA: Diagnosis not present

## 2019-06-30 DIAGNOSIS — L03119 Cellulitis of unspecified part of limb: Secondary | ICD-10-CM | POA: Diagnosis not present

## 2019-06-30 DIAGNOSIS — S80811A Abrasion, right lower leg, initial encounter: Secondary | ICD-10-CM | POA: Diagnosis not present

## 2019-06-30 DIAGNOSIS — I639 Cerebral infarction, unspecified: Secondary | ICD-10-CM | POA: Diagnosis not present

## 2019-06-30 DIAGNOSIS — J449 Chronic obstructive pulmonary disease, unspecified: Secondary | ICD-10-CM | POA: Diagnosis not present

## 2019-06-30 DIAGNOSIS — D513 Other dietary vitamin B12 deficiency anemia: Secondary | ICD-10-CM | POA: Diagnosis not present

## 2019-06-30 DIAGNOSIS — M79606 Pain in leg, unspecified: Secondary | ICD-10-CM | POA: Diagnosis not present

## 2019-06-30 DIAGNOSIS — M1990 Unspecified osteoarthritis, unspecified site: Secondary | ICD-10-CM | POA: Diagnosis not present

## 2019-06-30 DIAGNOSIS — I891 Lymphangitis: Secondary | ICD-10-CM | POA: Diagnosis not present

## 2019-06-30 DIAGNOSIS — Z881 Allergy status to other antibiotic agents status: Secondary | ICD-10-CM | POA: Diagnosis not present

## 2019-06-30 DIAGNOSIS — Z79899 Other long term (current) drug therapy: Secondary | ICD-10-CM | POA: Diagnosis not present

## 2019-06-30 DIAGNOSIS — K219 Gastro-esophageal reflux disease without esophagitis: Secondary | ICD-10-CM | POA: Diagnosis not present

## 2019-06-30 DIAGNOSIS — E039 Hypothyroidism, unspecified: Secondary | ICD-10-CM | POA: Diagnosis not present

## 2019-06-30 DIAGNOSIS — E1165 Type 2 diabetes mellitus with hyperglycemia: Secondary | ICD-10-CM | POA: Diagnosis not present

## 2019-06-30 DIAGNOSIS — I110 Hypertensive heart disease with heart failure: Secondary | ICD-10-CM | POA: Diagnosis not present

## 2019-06-30 DIAGNOSIS — Z794 Long term (current) use of insulin: Secondary | ICD-10-CM | POA: Diagnosis not present

## 2019-06-30 DIAGNOSIS — L03115 Cellulitis of right lower limb: Secondary | ICD-10-CM | POA: Diagnosis not present

## 2019-06-30 DIAGNOSIS — Z8673 Personal history of transient ischemic attack (TIA), and cerebral infarction without residual deficits: Secondary | ICD-10-CM | POA: Diagnosis not present

## 2019-06-30 DIAGNOSIS — Z9981 Dependence on supplemental oxygen: Secondary | ICD-10-CM | POA: Diagnosis not present

## 2019-06-30 DIAGNOSIS — I5032 Chronic diastolic (congestive) heart failure: Secondary | ICD-10-CM | POA: Diagnosis not present

## 2019-06-30 DIAGNOSIS — E1151 Type 2 diabetes mellitus with diabetic peripheral angiopathy without gangrene: Secondary | ICD-10-CM | POA: Diagnosis not present

## 2019-07-03 DIAGNOSIS — E1140 Type 2 diabetes mellitus with diabetic neuropathy, unspecified: Secondary | ICD-10-CM | POA: Diagnosis not present

## 2019-07-03 DIAGNOSIS — S80811D Abrasion, right lower leg, subsequent encounter: Secondary | ICD-10-CM | POA: Diagnosis not present

## 2019-07-03 DIAGNOSIS — E1151 Type 2 diabetes mellitus with diabetic peripheral angiopathy without gangrene: Secondary | ICD-10-CM | POA: Diagnosis not present

## 2019-07-03 DIAGNOSIS — J449 Chronic obstructive pulmonary disease, unspecified: Secondary | ICD-10-CM | POA: Diagnosis not present

## 2019-07-03 DIAGNOSIS — H548 Legal blindness, as defined in USA: Secondary | ICD-10-CM | POA: Diagnosis not present

## 2019-07-03 DIAGNOSIS — M1990 Unspecified osteoarthritis, unspecified site: Secondary | ICD-10-CM | POA: Diagnosis not present

## 2019-07-03 DIAGNOSIS — R2681 Unsteadiness on feet: Secondary | ICD-10-CM | POA: Diagnosis not present

## 2019-07-03 DIAGNOSIS — I5032 Chronic diastolic (congestive) heart failure: Secondary | ICD-10-CM | POA: Diagnosis not present

## 2019-07-03 DIAGNOSIS — I110 Hypertensive heart disease with heart failure: Secondary | ICD-10-CM | POA: Diagnosis not present

## 2019-07-03 DIAGNOSIS — K219 Gastro-esophageal reflux disease without esophagitis: Secondary | ICD-10-CM | POA: Diagnosis not present

## 2019-07-03 DIAGNOSIS — I2510 Atherosclerotic heart disease of native coronary artery without angina pectoris: Secondary | ICD-10-CM | POA: Diagnosis not present

## 2019-07-03 DIAGNOSIS — D513 Other dietary vitamin B12 deficiency anemia: Secondary | ICD-10-CM | POA: Diagnosis not present

## 2019-07-03 DIAGNOSIS — E039 Hypothyroidism, unspecified: Secondary | ICD-10-CM | POA: Diagnosis not present

## 2019-07-03 DIAGNOSIS — L03115 Cellulitis of right lower limb: Secondary | ICD-10-CM | POA: Diagnosis not present

## 2019-07-03 DIAGNOSIS — E1165 Type 2 diabetes mellitus with hyperglycemia: Secondary | ICD-10-CM | POA: Diagnosis not present

## 2019-07-03 DIAGNOSIS — Z79899 Other long term (current) drug therapy: Secondary | ICD-10-CM | POA: Diagnosis not present

## 2019-07-03 DIAGNOSIS — Z792 Long term (current) use of antibiotics: Secondary | ICD-10-CM | POA: Diagnosis not present

## 2019-07-03 DIAGNOSIS — D509 Iron deficiency anemia, unspecified: Secondary | ICD-10-CM | POA: Diagnosis not present

## 2019-07-03 DIAGNOSIS — Z794 Long term (current) use of insulin: Secondary | ICD-10-CM | POA: Diagnosis not present

## 2019-07-03 DIAGNOSIS — I252 Old myocardial infarction: Secondary | ICD-10-CM | POA: Diagnosis not present

## 2019-07-03 DIAGNOSIS — I889 Nonspecific lymphadenitis, unspecified: Secondary | ICD-10-CM | POA: Diagnosis not present

## 2019-07-03 DIAGNOSIS — Z9981 Dependence on supplemental oxygen: Secondary | ICD-10-CM | POA: Diagnosis not present

## 2019-07-03 DIAGNOSIS — E7800 Pure hypercholesterolemia, unspecified: Secondary | ICD-10-CM | POA: Diagnosis not present

## 2019-07-04 DIAGNOSIS — I509 Heart failure, unspecified: Secondary | ICD-10-CM | POA: Diagnosis not present

## 2019-07-04 DIAGNOSIS — R0902 Hypoxemia: Secondary | ICD-10-CM | POA: Diagnosis not present

## 2019-07-04 DIAGNOSIS — J449 Chronic obstructive pulmonary disease, unspecified: Secondary | ICD-10-CM | POA: Diagnosis not present

## 2019-07-06 DIAGNOSIS — E039 Hypothyroidism, unspecified: Secondary | ICD-10-CM | POA: Diagnosis not present

## 2019-07-06 DIAGNOSIS — Z09 Encounter for follow-up examination after completed treatment for conditions other than malignant neoplasm: Secondary | ICD-10-CM | POA: Diagnosis not present

## 2019-07-06 DIAGNOSIS — L03119 Cellulitis of unspecified part of limb: Secondary | ICD-10-CM | POA: Diagnosis not present

## 2019-07-11 DIAGNOSIS — K219 Gastro-esophageal reflux disease without esophagitis: Secondary | ICD-10-CM | POA: Diagnosis not present

## 2019-07-11 DIAGNOSIS — I252 Old myocardial infarction: Secondary | ICD-10-CM | POA: Diagnosis not present

## 2019-07-11 DIAGNOSIS — Z792 Long term (current) use of antibiotics: Secondary | ICD-10-CM | POA: Diagnosis not present

## 2019-07-11 DIAGNOSIS — Z9981 Dependence on supplemental oxygen: Secondary | ICD-10-CM | POA: Diagnosis not present

## 2019-07-11 DIAGNOSIS — E1165 Type 2 diabetes mellitus with hyperglycemia: Secondary | ICD-10-CM | POA: Diagnosis not present

## 2019-07-11 DIAGNOSIS — E1151 Type 2 diabetes mellitus with diabetic peripheral angiopathy without gangrene: Secondary | ICD-10-CM | POA: Diagnosis not present

## 2019-07-11 DIAGNOSIS — J449 Chronic obstructive pulmonary disease, unspecified: Secondary | ICD-10-CM | POA: Diagnosis not present

## 2019-07-11 DIAGNOSIS — I2510 Atherosclerotic heart disease of native coronary artery without angina pectoris: Secondary | ICD-10-CM | POA: Diagnosis not present

## 2019-07-11 DIAGNOSIS — H548 Legal blindness, as defined in USA: Secondary | ICD-10-CM | POA: Diagnosis not present

## 2019-07-11 DIAGNOSIS — I889 Nonspecific lymphadenitis, unspecified: Secondary | ICD-10-CM | POA: Diagnosis not present

## 2019-07-11 DIAGNOSIS — Z79899 Other long term (current) drug therapy: Secondary | ICD-10-CM | POA: Diagnosis not present

## 2019-07-11 DIAGNOSIS — R2681 Unsteadiness on feet: Secondary | ICD-10-CM | POA: Diagnosis not present

## 2019-07-11 DIAGNOSIS — D513 Other dietary vitamin B12 deficiency anemia: Secondary | ICD-10-CM | POA: Diagnosis not present

## 2019-07-11 DIAGNOSIS — D509 Iron deficiency anemia, unspecified: Secondary | ICD-10-CM | POA: Diagnosis not present

## 2019-07-11 DIAGNOSIS — E039 Hypothyroidism, unspecified: Secondary | ICD-10-CM | POA: Diagnosis not present

## 2019-07-11 DIAGNOSIS — I110 Hypertensive heart disease with heart failure: Secondary | ICD-10-CM | POA: Diagnosis not present

## 2019-07-11 DIAGNOSIS — S80811D Abrasion, right lower leg, subsequent encounter: Secondary | ICD-10-CM | POA: Diagnosis not present

## 2019-07-11 DIAGNOSIS — I5032 Chronic diastolic (congestive) heart failure: Secondary | ICD-10-CM | POA: Diagnosis not present

## 2019-07-11 DIAGNOSIS — L03115 Cellulitis of right lower limb: Secondary | ICD-10-CM | POA: Diagnosis not present

## 2019-07-11 DIAGNOSIS — Z794 Long term (current) use of insulin: Secondary | ICD-10-CM | POA: Diagnosis not present

## 2019-07-11 DIAGNOSIS — E1140 Type 2 diabetes mellitus with diabetic neuropathy, unspecified: Secondary | ICD-10-CM | POA: Diagnosis not present

## 2019-07-11 DIAGNOSIS — E7800 Pure hypercholesterolemia, unspecified: Secondary | ICD-10-CM | POA: Diagnosis not present

## 2019-07-11 DIAGNOSIS — M1990 Unspecified osteoarthritis, unspecified site: Secondary | ICD-10-CM | POA: Diagnosis not present

## 2019-07-12 DIAGNOSIS — J449 Chronic obstructive pulmonary disease, unspecified: Secondary | ICD-10-CM | POA: Diagnosis not present

## 2019-07-13 DIAGNOSIS — Z79899 Other long term (current) drug therapy: Secondary | ICD-10-CM | POA: Diagnosis not present

## 2019-07-13 DIAGNOSIS — Z9981 Dependence on supplemental oxygen: Secondary | ICD-10-CM | POA: Diagnosis not present

## 2019-07-13 DIAGNOSIS — E1165 Type 2 diabetes mellitus with hyperglycemia: Secondary | ICD-10-CM | POA: Diagnosis not present

## 2019-07-13 DIAGNOSIS — E1151 Type 2 diabetes mellitus with diabetic peripheral angiopathy without gangrene: Secondary | ICD-10-CM | POA: Diagnosis not present

## 2019-07-13 DIAGNOSIS — S80811D Abrasion, right lower leg, subsequent encounter: Secondary | ICD-10-CM | POA: Diagnosis not present

## 2019-07-13 DIAGNOSIS — Z794 Long term (current) use of insulin: Secondary | ICD-10-CM | POA: Diagnosis not present

## 2019-07-13 DIAGNOSIS — I889 Nonspecific lymphadenitis, unspecified: Secondary | ICD-10-CM | POA: Diagnosis not present

## 2019-07-13 DIAGNOSIS — M1990 Unspecified osteoarthritis, unspecified site: Secondary | ICD-10-CM | POA: Diagnosis not present

## 2019-07-13 DIAGNOSIS — I2510 Atherosclerotic heart disease of native coronary artery without angina pectoris: Secondary | ICD-10-CM | POA: Diagnosis not present

## 2019-07-13 DIAGNOSIS — R2681 Unsteadiness on feet: Secondary | ICD-10-CM | POA: Diagnosis not present

## 2019-07-13 DIAGNOSIS — E039 Hypothyroidism, unspecified: Secondary | ICD-10-CM | POA: Diagnosis not present

## 2019-07-13 DIAGNOSIS — I5032 Chronic diastolic (congestive) heart failure: Secondary | ICD-10-CM | POA: Diagnosis not present

## 2019-07-13 DIAGNOSIS — E7800 Pure hypercholesterolemia, unspecified: Secondary | ICD-10-CM | POA: Diagnosis not present

## 2019-07-13 DIAGNOSIS — D509 Iron deficiency anemia, unspecified: Secondary | ICD-10-CM | POA: Diagnosis not present

## 2019-07-13 DIAGNOSIS — Z792 Long term (current) use of antibiotics: Secondary | ICD-10-CM | POA: Diagnosis not present

## 2019-07-13 DIAGNOSIS — L03115 Cellulitis of right lower limb: Secondary | ICD-10-CM | POA: Diagnosis not present

## 2019-07-13 DIAGNOSIS — I110 Hypertensive heart disease with heart failure: Secondary | ICD-10-CM | POA: Diagnosis not present

## 2019-07-13 DIAGNOSIS — H548 Legal blindness, as defined in USA: Secondary | ICD-10-CM | POA: Diagnosis not present

## 2019-07-13 DIAGNOSIS — I252 Old myocardial infarction: Secondary | ICD-10-CM | POA: Diagnosis not present

## 2019-07-13 DIAGNOSIS — J449 Chronic obstructive pulmonary disease, unspecified: Secondary | ICD-10-CM | POA: Diagnosis not present

## 2019-07-13 DIAGNOSIS — K219 Gastro-esophageal reflux disease without esophagitis: Secondary | ICD-10-CM | POA: Diagnosis not present

## 2019-07-13 DIAGNOSIS — E1140 Type 2 diabetes mellitus with diabetic neuropathy, unspecified: Secondary | ICD-10-CM | POA: Diagnosis not present

## 2019-07-13 DIAGNOSIS — D513 Other dietary vitamin B12 deficiency anemia: Secondary | ICD-10-CM | POA: Diagnosis not present

## 2019-07-17 DIAGNOSIS — I509 Heart failure, unspecified: Secondary | ICD-10-CM | POA: Diagnosis not present

## 2019-07-17 DIAGNOSIS — J449 Chronic obstructive pulmonary disease, unspecified: Secondary | ICD-10-CM | POA: Diagnosis not present

## 2019-07-17 DIAGNOSIS — R0902 Hypoxemia: Secondary | ICD-10-CM | POA: Diagnosis not present

## 2019-07-18 DIAGNOSIS — E039 Hypothyroidism, unspecified: Secondary | ICD-10-CM | POA: Diagnosis not present

## 2019-07-18 DIAGNOSIS — I252 Old myocardial infarction: Secondary | ICD-10-CM | POA: Diagnosis not present

## 2019-07-18 DIAGNOSIS — Z79899 Other long term (current) drug therapy: Secondary | ICD-10-CM | POA: Diagnosis not present

## 2019-07-18 DIAGNOSIS — L03115 Cellulitis of right lower limb: Secondary | ICD-10-CM | POA: Diagnosis not present

## 2019-07-18 DIAGNOSIS — Z792 Long term (current) use of antibiotics: Secondary | ICD-10-CM | POA: Diagnosis not present

## 2019-07-18 DIAGNOSIS — S80811D Abrasion, right lower leg, subsequent encounter: Secondary | ICD-10-CM | POA: Diagnosis not present

## 2019-07-18 DIAGNOSIS — M1990 Unspecified osteoarthritis, unspecified site: Secondary | ICD-10-CM | POA: Diagnosis not present

## 2019-07-18 DIAGNOSIS — D509 Iron deficiency anemia, unspecified: Secondary | ICD-10-CM | POA: Diagnosis not present

## 2019-07-18 DIAGNOSIS — Z794 Long term (current) use of insulin: Secondary | ICD-10-CM | POA: Diagnosis not present

## 2019-07-18 DIAGNOSIS — E1140 Type 2 diabetes mellitus with diabetic neuropathy, unspecified: Secondary | ICD-10-CM | POA: Diagnosis not present

## 2019-07-18 DIAGNOSIS — D513 Other dietary vitamin B12 deficiency anemia: Secondary | ICD-10-CM | POA: Diagnosis not present

## 2019-07-18 DIAGNOSIS — Z9981 Dependence on supplemental oxygen: Secondary | ICD-10-CM | POA: Diagnosis not present

## 2019-07-18 DIAGNOSIS — E1151 Type 2 diabetes mellitus with diabetic peripheral angiopathy without gangrene: Secondary | ICD-10-CM | POA: Diagnosis not present

## 2019-07-18 DIAGNOSIS — J449 Chronic obstructive pulmonary disease, unspecified: Secondary | ICD-10-CM | POA: Diagnosis not present

## 2019-07-18 DIAGNOSIS — H548 Legal blindness, as defined in USA: Secondary | ICD-10-CM | POA: Diagnosis not present

## 2019-07-18 DIAGNOSIS — I5032 Chronic diastolic (congestive) heart failure: Secondary | ICD-10-CM | POA: Diagnosis not present

## 2019-07-18 DIAGNOSIS — I889 Nonspecific lymphadenitis, unspecified: Secondary | ICD-10-CM | POA: Diagnosis not present

## 2019-07-18 DIAGNOSIS — K219 Gastro-esophageal reflux disease without esophagitis: Secondary | ICD-10-CM | POA: Diagnosis not present

## 2019-07-18 DIAGNOSIS — E1165 Type 2 diabetes mellitus with hyperglycemia: Secondary | ICD-10-CM | POA: Diagnosis not present

## 2019-07-18 DIAGNOSIS — I110 Hypertensive heart disease with heart failure: Secondary | ICD-10-CM | POA: Diagnosis not present

## 2019-07-18 DIAGNOSIS — E7800 Pure hypercholesterolemia, unspecified: Secondary | ICD-10-CM | POA: Diagnosis not present

## 2019-07-18 DIAGNOSIS — R2681 Unsteadiness on feet: Secondary | ICD-10-CM | POA: Diagnosis not present

## 2019-07-18 DIAGNOSIS — I2510 Atherosclerotic heart disease of native coronary artery without angina pectoris: Secondary | ICD-10-CM | POA: Diagnosis not present

## 2019-07-30 DIAGNOSIS — J449 Chronic obstructive pulmonary disease, unspecified: Secondary | ICD-10-CM | POA: Diagnosis not present

## 2019-08-02 DIAGNOSIS — E7800 Pure hypercholesterolemia, unspecified: Secondary | ICD-10-CM | POA: Diagnosis not present

## 2019-08-02 DIAGNOSIS — I110 Hypertensive heart disease with heart failure: Secondary | ICD-10-CM | POA: Diagnosis not present

## 2019-08-02 DIAGNOSIS — E1165 Type 2 diabetes mellitus with hyperglycemia: Secondary | ICD-10-CM | POA: Diagnosis not present

## 2019-08-02 DIAGNOSIS — J449 Chronic obstructive pulmonary disease, unspecified: Secondary | ICD-10-CM | POA: Diagnosis not present

## 2019-08-02 DIAGNOSIS — E1151 Type 2 diabetes mellitus with diabetic peripheral angiopathy without gangrene: Secondary | ICD-10-CM | POA: Diagnosis not present

## 2019-08-02 DIAGNOSIS — M1990 Unspecified osteoarthritis, unspecified site: Secondary | ICD-10-CM | POA: Diagnosis not present

## 2019-08-02 DIAGNOSIS — Z79899 Other long term (current) drug therapy: Secondary | ICD-10-CM | POA: Diagnosis not present

## 2019-08-02 DIAGNOSIS — E039 Hypothyroidism, unspecified: Secondary | ICD-10-CM | POA: Diagnosis not present

## 2019-08-02 DIAGNOSIS — Z794 Long term (current) use of insulin: Secondary | ICD-10-CM | POA: Diagnosis not present

## 2019-08-02 DIAGNOSIS — I889 Nonspecific lymphadenitis, unspecified: Secondary | ICD-10-CM | POA: Diagnosis not present

## 2019-08-02 DIAGNOSIS — Z792 Long term (current) use of antibiotics: Secondary | ICD-10-CM | POA: Diagnosis not present

## 2019-08-02 DIAGNOSIS — S80811D Abrasion, right lower leg, subsequent encounter: Secondary | ICD-10-CM | POA: Diagnosis not present

## 2019-08-02 DIAGNOSIS — Z9981 Dependence on supplemental oxygen: Secondary | ICD-10-CM | POA: Diagnosis not present

## 2019-08-02 DIAGNOSIS — D513 Other dietary vitamin B12 deficiency anemia: Secondary | ICD-10-CM | POA: Diagnosis not present

## 2019-08-02 DIAGNOSIS — K219 Gastro-esophageal reflux disease without esophagitis: Secondary | ICD-10-CM | POA: Diagnosis not present

## 2019-08-02 DIAGNOSIS — D509 Iron deficiency anemia, unspecified: Secondary | ICD-10-CM | POA: Diagnosis not present

## 2019-08-02 DIAGNOSIS — E1140 Type 2 diabetes mellitus with diabetic neuropathy, unspecified: Secondary | ICD-10-CM | POA: Diagnosis not present

## 2019-08-02 DIAGNOSIS — L03115 Cellulitis of right lower limb: Secondary | ICD-10-CM | POA: Diagnosis not present

## 2019-08-02 DIAGNOSIS — H548 Legal blindness, as defined in USA: Secondary | ICD-10-CM | POA: Diagnosis not present

## 2019-08-02 DIAGNOSIS — I252 Old myocardial infarction: Secondary | ICD-10-CM | POA: Diagnosis not present

## 2019-08-02 DIAGNOSIS — R2681 Unsteadiness on feet: Secondary | ICD-10-CM | POA: Diagnosis not present

## 2019-08-02 DIAGNOSIS — I2510 Atherosclerotic heart disease of native coronary artery without angina pectoris: Secondary | ICD-10-CM | POA: Diagnosis not present

## 2019-08-02 DIAGNOSIS — I5032 Chronic diastolic (congestive) heart failure: Secondary | ICD-10-CM | POA: Diagnosis not present

## 2019-08-03 DIAGNOSIS — I509 Heart failure, unspecified: Secondary | ICD-10-CM | POA: Diagnosis not present

## 2019-08-03 DIAGNOSIS — J449 Chronic obstructive pulmonary disease, unspecified: Secondary | ICD-10-CM | POA: Diagnosis not present

## 2019-08-03 DIAGNOSIS — R0902 Hypoxemia: Secondary | ICD-10-CM | POA: Diagnosis not present

## 2019-08-11 DIAGNOSIS — J449 Chronic obstructive pulmonary disease, unspecified: Secondary | ICD-10-CM | POA: Diagnosis not present

## 2019-08-16 DIAGNOSIS — I252 Old myocardial infarction: Secondary | ICD-10-CM | POA: Diagnosis not present

## 2019-08-16 DIAGNOSIS — L03115 Cellulitis of right lower limb: Secondary | ICD-10-CM | POA: Diagnosis not present

## 2019-08-16 DIAGNOSIS — E7800 Pure hypercholesterolemia, unspecified: Secondary | ICD-10-CM | POA: Diagnosis not present

## 2019-08-16 DIAGNOSIS — S80811D Abrasion, right lower leg, subsequent encounter: Secondary | ICD-10-CM | POA: Diagnosis not present

## 2019-08-16 DIAGNOSIS — H548 Legal blindness, as defined in USA: Secondary | ICD-10-CM | POA: Diagnosis not present

## 2019-08-16 DIAGNOSIS — Z9981 Dependence on supplemental oxygen: Secondary | ICD-10-CM | POA: Diagnosis not present

## 2019-08-16 DIAGNOSIS — Z794 Long term (current) use of insulin: Secondary | ICD-10-CM | POA: Diagnosis not present

## 2019-08-16 DIAGNOSIS — K219 Gastro-esophageal reflux disease without esophagitis: Secondary | ICD-10-CM | POA: Diagnosis not present

## 2019-08-16 DIAGNOSIS — I2510 Atherosclerotic heart disease of native coronary artery without angina pectoris: Secondary | ICD-10-CM | POA: Diagnosis not present

## 2019-08-16 DIAGNOSIS — E1140 Type 2 diabetes mellitus with diabetic neuropathy, unspecified: Secondary | ICD-10-CM | POA: Diagnosis not present

## 2019-08-16 DIAGNOSIS — D509 Iron deficiency anemia, unspecified: Secondary | ICD-10-CM | POA: Diagnosis not present

## 2019-08-16 DIAGNOSIS — R2681 Unsteadiness on feet: Secondary | ICD-10-CM | POA: Diagnosis not present

## 2019-08-16 DIAGNOSIS — E039 Hypothyroidism, unspecified: Secondary | ICD-10-CM | POA: Diagnosis not present

## 2019-08-16 DIAGNOSIS — E1151 Type 2 diabetes mellitus with diabetic peripheral angiopathy without gangrene: Secondary | ICD-10-CM | POA: Diagnosis not present

## 2019-08-16 DIAGNOSIS — I110 Hypertensive heart disease with heart failure: Secondary | ICD-10-CM | POA: Diagnosis not present

## 2019-08-16 DIAGNOSIS — J449 Chronic obstructive pulmonary disease, unspecified: Secondary | ICD-10-CM | POA: Diagnosis not present

## 2019-08-16 DIAGNOSIS — I509 Heart failure, unspecified: Secondary | ICD-10-CM | POA: Diagnosis not present

## 2019-08-16 DIAGNOSIS — R0902 Hypoxemia: Secondary | ICD-10-CM | POA: Diagnosis not present

## 2019-08-16 DIAGNOSIS — E1165 Type 2 diabetes mellitus with hyperglycemia: Secondary | ICD-10-CM | POA: Diagnosis not present

## 2019-08-16 DIAGNOSIS — I889 Nonspecific lymphadenitis, unspecified: Secondary | ICD-10-CM | POA: Diagnosis not present

## 2019-08-16 DIAGNOSIS — D513 Other dietary vitamin B12 deficiency anemia: Secondary | ICD-10-CM | POA: Diagnosis not present

## 2019-08-16 DIAGNOSIS — M1990 Unspecified osteoarthritis, unspecified site: Secondary | ICD-10-CM | POA: Diagnosis not present

## 2019-08-16 DIAGNOSIS — I5032 Chronic diastolic (congestive) heart failure: Secondary | ICD-10-CM | POA: Diagnosis not present

## 2019-08-16 DIAGNOSIS — Z792 Long term (current) use of antibiotics: Secondary | ICD-10-CM | POA: Diagnosis not present

## 2019-08-16 DIAGNOSIS — Z79899 Other long term (current) drug therapy: Secondary | ICD-10-CM | POA: Diagnosis not present

## 2019-08-21 ENCOUNTER — Other Ambulatory Visit: Payer: Self-pay | Admitting: Cardiology

## 2019-08-30 DIAGNOSIS — I889 Nonspecific lymphadenitis, unspecified: Secondary | ICD-10-CM | POA: Diagnosis not present

## 2019-08-30 DIAGNOSIS — E7800 Pure hypercholesterolemia, unspecified: Secondary | ICD-10-CM | POA: Diagnosis not present

## 2019-08-30 DIAGNOSIS — I2510 Atherosclerotic heart disease of native coronary artery without angina pectoris: Secondary | ICD-10-CM | POA: Diagnosis not present

## 2019-08-30 DIAGNOSIS — M1990 Unspecified osteoarthritis, unspecified site: Secondary | ICD-10-CM | POA: Diagnosis not present

## 2019-08-30 DIAGNOSIS — R2681 Unsteadiness on feet: Secondary | ICD-10-CM | POA: Diagnosis not present

## 2019-08-30 DIAGNOSIS — L03115 Cellulitis of right lower limb: Secondary | ICD-10-CM | POA: Diagnosis not present

## 2019-08-30 DIAGNOSIS — D513 Other dietary vitamin B12 deficiency anemia: Secondary | ICD-10-CM | POA: Diagnosis not present

## 2019-08-30 DIAGNOSIS — E1151 Type 2 diabetes mellitus with diabetic peripheral angiopathy without gangrene: Secondary | ICD-10-CM | POA: Diagnosis not present

## 2019-08-30 DIAGNOSIS — E1140 Type 2 diabetes mellitus with diabetic neuropathy, unspecified: Secondary | ICD-10-CM | POA: Diagnosis not present

## 2019-08-30 DIAGNOSIS — I110 Hypertensive heart disease with heart failure: Secondary | ICD-10-CM | POA: Diagnosis not present

## 2019-08-30 DIAGNOSIS — E039 Hypothyroidism, unspecified: Secondary | ICD-10-CM | POA: Diagnosis not present

## 2019-08-30 DIAGNOSIS — I252 Old myocardial infarction: Secondary | ICD-10-CM | POA: Diagnosis not present

## 2019-08-30 DIAGNOSIS — D509 Iron deficiency anemia, unspecified: Secondary | ICD-10-CM | POA: Diagnosis not present

## 2019-08-30 DIAGNOSIS — J449 Chronic obstructive pulmonary disease, unspecified: Secondary | ICD-10-CM | POA: Diagnosis not present

## 2019-08-30 DIAGNOSIS — I5032 Chronic diastolic (congestive) heart failure: Secondary | ICD-10-CM | POA: Diagnosis not present

## 2019-08-30 DIAGNOSIS — S80811D Abrasion, right lower leg, subsequent encounter: Secondary | ICD-10-CM | POA: Diagnosis not present

## 2019-08-30 DIAGNOSIS — Z792 Long term (current) use of antibiotics: Secondary | ICD-10-CM | POA: Diagnosis not present

## 2019-08-30 DIAGNOSIS — Z79899 Other long term (current) drug therapy: Secondary | ICD-10-CM | POA: Diagnosis not present

## 2019-08-30 DIAGNOSIS — Z9981 Dependence on supplemental oxygen: Secondary | ICD-10-CM | POA: Diagnosis not present

## 2019-08-30 DIAGNOSIS — E1165 Type 2 diabetes mellitus with hyperglycemia: Secondary | ICD-10-CM | POA: Diagnosis not present

## 2019-08-30 DIAGNOSIS — H548 Legal blindness, as defined in USA: Secondary | ICD-10-CM | POA: Diagnosis not present

## 2019-08-30 DIAGNOSIS — K219 Gastro-esophageal reflux disease without esophagitis: Secondary | ICD-10-CM | POA: Diagnosis not present

## 2019-08-30 DIAGNOSIS — Z794 Long term (current) use of insulin: Secondary | ICD-10-CM | POA: Diagnosis not present

## 2019-09-03 DIAGNOSIS — J449 Chronic obstructive pulmonary disease, unspecified: Secondary | ICD-10-CM | POA: Diagnosis not present

## 2019-09-03 DIAGNOSIS — R0902 Hypoxemia: Secondary | ICD-10-CM | POA: Diagnosis not present

## 2019-09-03 DIAGNOSIS — I509 Heart failure, unspecified: Secondary | ICD-10-CM | POA: Diagnosis not present

## 2019-09-11 DIAGNOSIS — J449 Chronic obstructive pulmonary disease, unspecified: Secondary | ICD-10-CM | POA: Diagnosis not present

## 2019-09-16 DIAGNOSIS — R0902 Hypoxemia: Secondary | ICD-10-CM | POA: Diagnosis not present

## 2019-09-16 DIAGNOSIS — I509 Heart failure, unspecified: Secondary | ICD-10-CM | POA: Diagnosis not present

## 2019-09-16 DIAGNOSIS — J449 Chronic obstructive pulmonary disease, unspecified: Secondary | ICD-10-CM | POA: Diagnosis not present

## 2019-09-30 DIAGNOSIS — J449 Chronic obstructive pulmonary disease, unspecified: Secondary | ICD-10-CM | POA: Diagnosis not present

## 2019-10-01 DIAGNOSIS — H548 Legal blindness, as defined in USA: Secondary | ICD-10-CM | POA: Diagnosis not present

## 2019-10-01 DIAGNOSIS — R0902 Hypoxemia: Secondary | ICD-10-CM | POA: Diagnosis not present

## 2019-10-01 DIAGNOSIS — IMO0001 Reserved for inherently not codable concepts without codable children: Secondary | ICD-10-CM

## 2019-10-01 DIAGNOSIS — J9600 Acute respiratory failure, unspecified whether with hypoxia or hypercapnia: Secondary | ICD-10-CM | POA: Diagnosis not present

## 2019-10-01 DIAGNOSIS — E1151 Type 2 diabetes mellitus with diabetic peripheral angiopathy without gangrene: Secondary | ICD-10-CM | POA: Diagnosis not present

## 2019-10-01 DIAGNOSIS — I509 Heart failure, unspecified: Secondary | ICD-10-CM | POA: Diagnosis not present

## 2019-10-01 DIAGNOSIS — I340 Nonrheumatic mitral (valve) insufficiency: Secondary | ICD-10-CM | POA: Diagnosis not present

## 2019-10-01 DIAGNOSIS — I110 Hypertensive heart disease with heart failure: Secondary | ICD-10-CM | POA: Diagnosis not present

## 2019-10-01 DIAGNOSIS — Z79899 Other long term (current) drug therapy: Secondary | ICD-10-CM | POA: Diagnosis not present

## 2019-10-01 DIAGNOSIS — I5030 Unspecified diastolic (congestive) heart failure: Secondary | ICD-10-CM | POA: Diagnosis not present

## 2019-10-01 DIAGNOSIS — Z87891 Personal history of nicotine dependence: Secondary | ICD-10-CM | POA: Diagnosis not present

## 2019-10-01 DIAGNOSIS — R0602 Shortness of breath: Secondary | ICD-10-CM | POA: Diagnosis not present

## 2019-10-01 DIAGNOSIS — J9621 Acute and chronic respiratory failure with hypoxia: Secondary | ICD-10-CM | POA: Diagnosis not present

## 2019-10-01 DIAGNOSIS — Z20822 Contact with and (suspected) exposure to covid-19: Secondary | ICD-10-CM | POA: Diagnosis not present

## 2019-10-01 DIAGNOSIS — E1165 Type 2 diabetes mellitus with hyperglycemia: Secondary | ICD-10-CM | POA: Diagnosis not present

## 2019-10-01 DIAGNOSIS — E1142 Type 2 diabetes mellitus with diabetic polyneuropathy: Secondary | ICD-10-CM | POA: Diagnosis not present

## 2019-10-01 DIAGNOSIS — E785 Hyperlipidemia, unspecified: Secondary | ICD-10-CM | POA: Diagnosis not present

## 2019-10-01 DIAGNOSIS — I5021 Acute systolic (congestive) heart failure: Secondary | ICD-10-CM | POA: Diagnosis not present

## 2019-10-01 DIAGNOSIS — Z7983 Long term (current) use of bisphosphonates: Secondary | ICD-10-CM | POA: Diagnosis not present

## 2019-10-01 DIAGNOSIS — M1990 Unspecified osteoarthritis, unspecified site: Secondary | ICD-10-CM | POA: Diagnosis not present

## 2019-10-01 DIAGNOSIS — N200 Calculus of kidney: Secondary | ICD-10-CM | POA: Diagnosis not present

## 2019-10-01 DIAGNOSIS — J9622 Acute and chronic respiratory failure with hypercapnia: Secondary | ICD-10-CM | POA: Diagnosis not present

## 2019-10-01 DIAGNOSIS — I517 Cardiomegaly: Secondary | ICD-10-CM | POA: Diagnosis not present

## 2019-10-01 DIAGNOSIS — Z794 Long term (current) use of insulin: Secondary | ICD-10-CM | POA: Diagnosis not present

## 2019-10-01 DIAGNOSIS — I5033 Acute on chronic diastolic (congestive) heart failure: Secondary | ICD-10-CM | POA: Diagnosis not present

## 2019-10-01 DIAGNOSIS — E876 Hypokalemia: Secondary | ICD-10-CM | POA: Diagnosis not present

## 2019-10-01 DIAGNOSIS — E039 Hypothyroidism, unspecified: Secondary | ICD-10-CM | POA: Insufficient documentation

## 2019-10-01 DIAGNOSIS — J441 Chronic obstructive pulmonary disease with (acute) exacerbation: Secondary | ICD-10-CM | POA: Diagnosis not present

## 2019-10-01 DIAGNOSIS — R2243 Localized swelling, mass and lump, lower limb, bilateral: Secondary | ICD-10-CM | POA: Diagnosis not present

## 2019-10-01 DIAGNOSIS — N179 Acute kidney failure, unspecified: Secondary | ICD-10-CM | POA: Diagnosis not present

## 2019-10-01 DIAGNOSIS — Z8673 Personal history of transient ischemic attack (TIA), and cerebral infarction without residual deficits: Secondary | ICD-10-CM | POA: Diagnosis not present

## 2019-10-01 DIAGNOSIS — J449 Chronic obstructive pulmonary disease, unspecified: Secondary | ICD-10-CM

## 2019-10-01 DIAGNOSIS — D638 Anemia in other chronic diseases classified elsewhere: Secondary | ICD-10-CM | POA: Diagnosis not present

## 2019-10-01 DIAGNOSIS — Z743 Need for continuous supervision: Secondary | ICD-10-CM | POA: Diagnosis not present

## 2019-10-01 DIAGNOSIS — Z7902 Long term (current) use of antithrombotics/antiplatelets: Secondary | ICD-10-CM | POA: Diagnosis not present

## 2019-10-01 DIAGNOSIS — I2510 Atherosclerotic heart disease of native coronary artery without angina pectoris: Secondary | ICD-10-CM | POA: Diagnosis not present

## 2019-10-01 HISTORY — DX: Reserved for inherently not codable concepts without codable children: IMO0001

## 2019-10-01 HISTORY — DX: Chronic obstructive pulmonary disease, unspecified: J44.9

## 2019-10-01 HISTORY — DX: Hypothyroidism, unspecified: E03.9

## 2019-10-02 DIAGNOSIS — I5033 Acute on chronic diastolic (congestive) heart failure: Secondary | ICD-10-CM | POA: Diagnosis not present

## 2019-10-02 DIAGNOSIS — I110 Hypertensive heart disease with heart failure: Secondary | ICD-10-CM | POA: Diagnosis not present

## 2019-10-02 DIAGNOSIS — J9622 Acute and chronic respiratory failure with hypercapnia: Secondary | ICD-10-CM | POA: Diagnosis not present

## 2019-10-02 DIAGNOSIS — N179 Acute kidney failure, unspecified: Secondary | ICD-10-CM | POA: Diagnosis not present

## 2019-10-04 DIAGNOSIS — R0902 Hypoxemia: Secondary | ICD-10-CM | POA: Diagnosis not present

## 2019-10-04 DIAGNOSIS — I509 Heart failure, unspecified: Secondary | ICD-10-CM | POA: Diagnosis not present

## 2019-10-04 DIAGNOSIS — J449 Chronic obstructive pulmonary disease, unspecified: Secondary | ICD-10-CM | POA: Diagnosis not present

## 2019-10-10 DIAGNOSIS — Z79899 Other long term (current) drug therapy: Secondary | ICD-10-CM | POA: Diagnosis not present

## 2019-10-10 DIAGNOSIS — Z09 Encounter for follow-up examination after completed treatment for conditions other than malignant neoplasm: Secondary | ICD-10-CM | POA: Diagnosis not present

## 2019-10-10 DIAGNOSIS — K219 Gastro-esophageal reflux disease without esophagitis: Secondary | ICD-10-CM | POA: Diagnosis not present

## 2019-10-10 DIAGNOSIS — E039 Hypothyroidism, unspecified: Secondary | ICD-10-CM | POA: Diagnosis not present

## 2019-10-10 DIAGNOSIS — N390 Urinary tract infection, site not specified: Secondary | ICD-10-CM | POA: Diagnosis not present

## 2019-10-10 DIAGNOSIS — E1140 Type 2 diabetes mellitus with diabetic neuropathy, unspecified: Secondary | ICD-10-CM | POA: Diagnosis not present

## 2019-10-10 DIAGNOSIS — I5032 Chronic diastolic (congestive) heart failure: Secondary | ICD-10-CM | POA: Diagnosis not present

## 2019-10-10 DIAGNOSIS — J449 Chronic obstructive pulmonary disease, unspecified: Secondary | ICD-10-CM | POA: Diagnosis not present

## 2019-10-12 DIAGNOSIS — J449 Chronic obstructive pulmonary disease, unspecified: Secondary | ICD-10-CM | POA: Diagnosis not present

## 2019-10-17 DIAGNOSIS — J449 Chronic obstructive pulmonary disease, unspecified: Secondary | ICD-10-CM | POA: Diagnosis not present

## 2019-10-17 DIAGNOSIS — I509 Heart failure, unspecified: Secondary | ICD-10-CM | POA: Diagnosis not present

## 2019-10-17 DIAGNOSIS — R0902 Hypoxemia: Secondary | ICD-10-CM | POA: Diagnosis not present

## 2019-10-19 ENCOUNTER — Other Ambulatory Visit: Payer: Self-pay

## 2019-10-19 MED ORDER — CLOPIDOGREL BISULFATE 75 MG PO TABS: 75.0000 mg | ORAL_TABLET | Freq: Every day | ORAL | 3 refills | Status: DC

## 2019-10-26 ENCOUNTER — Encounter: Payer: Self-pay | Admitting: Cardiology

## 2019-10-26 ENCOUNTER — Other Ambulatory Visit: Payer: Self-pay

## 2019-10-26 ENCOUNTER — Ambulatory Visit (INDEPENDENT_AMBULATORY_CARE_PROVIDER_SITE_OTHER): Payer: Medicare Other | Admitting: Cardiology

## 2019-10-26 VITALS — BP 140/50 | HR 62 | Ht 62.5 in | Wt 161.0 lb

## 2019-10-26 DIAGNOSIS — I6350 Cerebral infarction due to unspecified occlusion or stenosis of unspecified cerebral artery: Secondary | ICD-10-CM

## 2019-10-26 DIAGNOSIS — I2510 Atherosclerotic heart disease of native coronary artery without angina pectoris: Secondary | ICD-10-CM

## 2019-10-26 DIAGNOSIS — E1139 Type 2 diabetes mellitus with other diabetic ophthalmic complication: Secondary | ICD-10-CM

## 2019-10-26 DIAGNOSIS — I10 Essential (primary) hypertension: Secondary | ICD-10-CM

## 2019-10-26 DIAGNOSIS — I639 Cerebral infarction, unspecified: Secondary | ICD-10-CM

## 2019-10-26 DIAGNOSIS — F17210 Nicotine dependence, cigarettes, uncomplicated: Secondary | ICD-10-CM | POA: Diagnosis not present

## 2019-10-26 DIAGNOSIS — E785 Hyperlipidemia, unspecified: Secondary | ICD-10-CM

## 2019-10-26 NOTE — Progress Notes (Signed)
Cardiology Office Note:    Date:  10/26/2019   ID:  Brooke Hudson, DOB 1951/04/25, MRN [id number]  PCP:  Renaldo Reel, PA  Cardiologist:  Jenean Lindau, MD   Referring MD: Renaldo Reel, PA    ASSESSMENT:    1. Coronary artery disease involving native coronary artery of native heart without angina pectoris   2. Essential hypertension   3. Left pontine stroke ([place])   4. Cigarette smoker   5. Dyslipidemia   6. DM type 2 causing eye disease, not at goal [facility])    PLAN:    In order of problems listed above:  1. Preoperative cardiovascular evaluation: Patient feels that she might need gallbladder surgery.  She is not sure about it.  I told her that if she needs it then I would definitely consider her evaluating with a Lexiscan sestamibi at the hospital to assess her risk factors.  She has coronary artery disease and unfortunately continues to smoke. Essential hypertension: Blood pressure is stable. Mixed dyslipidemia: Lipids followed by primary care physician.  She tells me that she had blood work done recently. She also mentions to me that her hemoglobin A1c was 8 so she is a diabetic.  Again in view of this she is at significantly elevated risk for any surgery.  But once she gets to know that she definitely needs the surgery we will assess her again with a Lexiscan sestamibi and give her a final recommendations. Cigarette smoker: I spent 5 minutes with the patient discussing solely about smoking. Smoking cessation was counseled. I suggested to the patient also different medications and pharmacological interventions. Patient is keen to try stopping on its own at this time. He will get back to me if he needs any further assistance in this matter. Patient will be seen in follow-up appointment in 6 months or earlier if the patient has any concerns   Medication Adjustments/Labs and Tests Ordered: Current medicines are reviewed at length with the patient today.  Concerns regarding  medicines are outlined above.  Orders Placed This Encounter  Procedures  . EKG 12-Lead   No orders of the defined types were placed in this encounter.    Chief Complaint  Patient presents with  . Re-Establish Care     History of Present Illness:    Brooke Hudson is a 69 y.o. female.  Patient has past medical history of coronary artery disease, essential hypertension, stroke and dyslipidemia.  She is on oxygen.  But she continues to smoke.  She is considering the possibility of gallbladder surgery.  She is not sure whether she needs it and whether she will get it done.  She is brought in via wheelchair.  Her granddaughter is very supportive and with her.  Past Medical History:  Diagnosis Date  . Coronary artery disease   . CVD (cardiovascular disease)   . Daily headache    "lately daily; normally it's weekly" (01/07/2016)  . Hypercholesterolemia   . Hypertension   . Left pontine stroke ([place]) 11/14/2017  . Myocardial infarction ([place]) ~ 2005  . Myocardial infarction ([place])   . On home oxygen therapy    "2L just at night" (01/07/2016)  . Pneumonia ~ 2016  . Shortness of breath dyspnea    "alot" (01/07/2016)  . Stroke (cerebrum) ([place]) 2010  . TIA (transient ischemic attack)   . Type II diabetes mellitus ([place])     Past Surgical History:  Procedure Laterality Date  . CATARACT EXTRACTION W/  INTRAOCULAR LENS  IMPLANT, BILATERAL Bilateral   . CESAREAN SECTION  1980  . CORONARY ANGIOPLASTY WITH STENT PLACEMENT  ~ 2005 X 2   "put 5 stents in w/in 1 wk; [address]"  . EYE SURGERY    . GLAUCOMA SURGERY Bilateral   . TUBAL LIGATION      Current Medications: Current Meds  Medication Sig  . albuterol (PROVENTIL HFA;VENTOLIN HFA) 108 (90 Base) MCG/ACT inhaler Inhale 1 puff into the lungs every 6 (six) hours as needed for wheezing or shortness of breath.  Marland Kitchen alendronate (FOSAMAX) 70 MG tablet Take 70 mg by mouth once a week. Take with a full glass of water on an empty stomach.  Marland Kitchen  amLODipine (NORVASC) 5 MG tablet   . ARNUITY ELLIPTA 200 MCG/ACT AEPB   . atenolol (TENORMIN) 50 MG tablet Take 50 mg by mouth daily.  . bethanechol (URECHOLINE) 25 MG tablet   . Blood Glucose Monitoring Suppl (PRODIGY POCKET BLOOD GLUCOSE) w/Device KIT   . cilostazol (PLETAL) 100 MG tablet Take 100 mg by mouth 2 (two) times daily.  . clopidogrel (PLAVIX) 75 MG tablet Take 1 tablet (75 mg total) by mouth daily.  Marland Kitchen escitalopram (LEXAPRO) 10 MG tablet Take 10 mg by mouth daily.  . furosemide (LASIX) 40 MG tablet   . gabapentin (NEURONTIN) 300 MG capsule Take 1 capsule by mouth 3 (three) times daily.  Marland Kitchen glucose blood (PRODIGY NO CODING BLOOD GLUC) test strip   . Insulin Degludec (TRESIBA FLEXTOUCH) 100 UNIT/ML SOPN Inject 25 Units into the skin at bedtime.  . Insulin Pen Needle (EASY COMFORT PEN NEEDLES) 31G X 8 MM MISC by Does not apply route.  Marland Kitchen ipratropium-albuterol (DUONEB) 0.5-2.5 (3) MG/3ML SOLN USE 1 VIAL IN NEBULIZER EVERY 6 HOURS AS NEEDED  . levothyroxine (SYNTHROID, LEVOTHROID) 25 MCG tablet Take 1 tablet by mouth daily.  . pantoprazole (PROTONIX) 40 MG tablet   . rosuvastatin (CRESTOR) 20 MG tablet   . sitaGLIPtin (JANUVIA) 100 MG tablet Take 100 mg by mouth daily.  Marland Kitchen STIOLTO RESPIMAT 2.5-2.5 MCG/ACT AERS      Allergies:   Bactrim [sulfamethoxazole-trimethoprim], Benzocaine, and Metformin and related   Social History   Socioeconomic History  . Marital status: Divorced    Spouse name: Not on file  . Number of children: 3  . Years of education: 43  . Highest education level: Not on file  Occupational History  . Not on file  Tobacco Use  . Smoking status: Current Every Day Smoker    Packs/day: 0.50    Years: 36.00    Pack years: 18.00    Types: Cigarettes  . Smokeless tobacco: Never Used  Substance and Sexual Activity  . Alcohol use: No  . Drug use: No  . Sexual activity: Never  Other Topics Concern  . Not on file  Social History Narrative   Lives with daughter      Caffeine use: Coffee daily, soda daily    Left handed    Social Determinants of Health   Financial Resource Strain:   . Difficulty of Paying Living Expenses: Not on file  Food Insecurity:   . Worried About Charity fundraiser in the Last Year: Not on file  . Ran Out of Food in the Last Year: Not on file  Transportation Needs:   . Lack of Transportation (Medical): Not on file  . Lack of Transportation (Non-Medical): Not on file  Physical Activity:   . Days of Exercise per Week: Not  on file  . Minutes of Exercise per Session: Not on file  Stress:   . Feeling of Stress : Not on file  Social Connections:   . Frequency of Communication with Friends and Family: Not on file  . Frequency of Social Gatherings with Friends and Family: Not on file  . Attends Religious Services: Not on file  . Active Member of Clubs or Organizations: Not on file  . Attends Archivist Meetings: Not on file  . Marital Status: Not on file     Family History: The patient's family history includes COPD in her sister; Cancer - Lung in her father; Diabetes in her mother, sister, sister, and another family member; Heart disease in her sister; Hypertension in her sister; Renal Disease in her sister.  ROS:   Please see the history of present illness.    All other systems reviewed and are negative.  EKGs/Labs/Other Studies Reviewed:    The following studies were reviewed today: EKG reveals sinus rhythm and nonspecific ST-T changes   Recent Labs: No results found for requested labs within last 8760 hours.  Recent Lipid Panel    Component Value Date/Time   CHOL 139 08/08/2018 1032   TRIG 120 08/08/2018 1032   HDL 44 08/08/2018 1032   CHOLHDL 3.2 08/08/2018 1032   LDLCALC 71 08/08/2018 1032    Physical Exam:    VS:  BP (!) 140/50   Pulse 62   Ht 5' 2.5" (1.588 m)   Wt 161 lb (73 kg)   SpO2 99% Comment: 2 Liters of Oxygen  BMI 28.98 kg/m     Wt Readings from Last 3 Encounters:   10/26/19 161 lb (73 kg)  08/08/18 156 lb (70.8 kg)  07/07/18 156 lb (70.8 kg)     GEN: Patient is in no acute distress HEENT: Normal NECK: No JVD; No carotid bruits LYMPHATICS: No lymphadenopathy CARDIAC: Hear sounds regular, 2/6 systolic murmur at the apex. RESPIRATORY:  Clear to auscultation without rales, wheezing or rhonchi  ABDOMEN: Soft, non-tender, non-distended MUSCULOSKELETAL:  No edema; No deformity  SKIN: Warm and dry NEUROLOGIC:  Alert and oriented x 3 PSYCHIATRIC:  Normal affect   Signed, Jenean Lindau, MD  10/26/2019 11:46 AM    [place]

## 2019-10-26 NOTE — Patient Instructions (Signed)
Medication Instructions:  No medication changes *If you need a refill on your cardiac medications before your next appointment, please call your pharmacy*   Lab Work: None ordered If you have labs (blood work) drawn today and your tests are completely normal, you will receive your results only by: Marland Kitchen MyChart Message (if you have MyChart) OR . A paper copy in the mail If you have any lab test that is abnormal or we need to change your treatment, we will call you to review the results.   Testing/Procedures: noneordered   Follow-Up: At [facility], you and your health needs are our priority.  As part of our continuing mission to provide you with exceptional heart care, we have created designated Provider Care Teams.  These Care Teams include your primary Cardiologist (physician) and Advanced Practice Providers (APPs -  Physician Assistants and Nurse Practitioners) who all work together to provide you with the care you need, when you need it.  We recommend signing up for the patient portal called "MyChart".  Sign up information is provided on this After Visit Summary.  MyChart is used to connect with patients for Virtual Visits (Telemedicine).  Patients are able to view lab/test results, encounter notes, upcoming appointments, etc.  Non-urgent messages can be sent to your provider as well.   To learn more about what you can do with MyChart, go to [email or web address].    Your next appointment:   6 month(s)  The format for your next appointment:   In Person  Provider:   Jyl Heinz, MD   Other Instructions NA

## 2019-10-28 DIAGNOSIS — J449 Chronic obstructive pulmonary disease, unspecified: Secondary | ICD-10-CM | POA: Diagnosis not present

## 2019-10-31 DIAGNOSIS — G4733 Obstructive sleep apnea (adult) (pediatric): Secondary | ICD-10-CM | POA: Diagnosis not present

## 2019-10-31 DIAGNOSIS — R5383 Other fatigue: Secondary | ICD-10-CM | POA: Diagnosis not present

## 2019-10-31 DIAGNOSIS — J4530 Mild persistent asthma, uncomplicated: Secondary | ICD-10-CM | POA: Diagnosis not present

## 2019-10-31 DIAGNOSIS — J301 Allergic rhinitis due to pollen: Secondary | ICD-10-CM | POA: Diagnosis not present

## 2019-11-01 DIAGNOSIS — R0902 Hypoxemia: Secondary | ICD-10-CM | POA: Diagnosis not present

## 2019-11-01 DIAGNOSIS — I509 Heart failure, unspecified: Secondary | ICD-10-CM | POA: Diagnosis not present

## 2019-11-01 DIAGNOSIS — J449 Chronic obstructive pulmonary disease, unspecified: Secondary | ICD-10-CM | POA: Diagnosis not present

## 2019-11-07 DIAGNOSIS — M79605 Pain in left leg: Secondary | ICD-10-CM | POA: Diagnosis not present

## 2019-11-07 DIAGNOSIS — R609 Edema, unspecified: Secondary | ICD-10-CM | POA: Diagnosis not present

## 2019-11-07 DIAGNOSIS — Z743 Need for continuous supervision: Secondary | ICD-10-CM | POA: Diagnosis not present

## 2019-11-07 DIAGNOSIS — M7989 Other specified soft tissue disorders: Secondary | ICD-10-CM | POA: Diagnosis not present

## 2019-11-07 DIAGNOSIS — L03116 Cellulitis of left lower limb: Secondary | ICD-10-CM | POA: Diagnosis not present

## 2019-11-07 DIAGNOSIS — R5381 Other malaise: Secondary | ICD-10-CM | POA: Diagnosis not present

## 2019-11-09 DIAGNOSIS — J449 Chronic obstructive pulmonary disease, unspecified: Secondary | ICD-10-CM | POA: Diagnosis not present

## 2019-11-14 DIAGNOSIS — J449 Chronic obstructive pulmonary disease, unspecified: Secondary | ICD-10-CM | POA: Diagnosis not present

## 2019-11-14 DIAGNOSIS — I509 Heart failure, unspecified: Secondary | ICD-10-CM | POA: Diagnosis not present

## 2019-11-14 DIAGNOSIS — R0902 Hypoxemia: Secondary | ICD-10-CM | POA: Diagnosis not present

## 2019-11-16 DIAGNOSIS — G4733 Obstructive sleep apnea (adult) (pediatric): Secondary | ICD-10-CM | POA: Diagnosis not present

## 2019-11-16 DIAGNOSIS — R0683 Snoring: Secondary | ICD-10-CM | POA: Diagnosis not present

## 2019-11-16 DIAGNOSIS — Z711 Person with feared health complaint in whom no diagnosis is made: Secondary | ICD-10-CM | POA: Diagnosis not present

## 2019-11-28 DIAGNOSIS — J449 Chronic obstructive pulmonary disease, unspecified: Secondary | ICD-10-CM | POA: Diagnosis not present

## 2019-12-02 DIAGNOSIS — R0902 Hypoxemia: Secondary | ICD-10-CM | POA: Diagnosis not present

## 2019-12-02 DIAGNOSIS — J449 Chronic obstructive pulmonary disease, unspecified: Secondary | ICD-10-CM | POA: Diagnosis not present

## 2019-12-02 DIAGNOSIS — I509 Heart failure, unspecified: Secondary | ICD-10-CM | POA: Diagnosis not present

## 2019-12-04 DIAGNOSIS — R195 Other fecal abnormalities: Secondary | ICD-10-CM | POA: Diagnosis not present

## 2019-12-04 DIAGNOSIS — D500 Iron deficiency anemia secondary to blood loss (chronic): Secondary | ICD-10-CM | POA: Diagnosis not present

## 2019-12-04 DIAGNOSIS — R1310 Dysphagia, unspecified: Secondary | ICD-10-CM | POA: Diagnosis not present

## 2019-12-04 DIAGNOSIS — K219 Gastro-esophageal reflux disease without esophagitis: Secondary | ICD-10-CM | POA: Diagnosis not present

## 2019-12-10 DIAGNOSIS — J449 Chronic obstructive pulmonary disease, unspecified: Secondary | ICD-10-CM | POA: Diagnosis not present

## 2019-12-15 DIAGNOSIS — R0902 Hypoxemia: Secondary | ICD-10-CM | POA: Diagnosis not present

## 2019-12-15 DIAGNOSIS — J449 Chronic obstructive pulmonary disease, unspecified: Secondary | ICD-10-CM | POA: Diagnosis not present

## 2019-12-15 DIAGNOSIS — I509 Heart failure, unspecified: Secondary | ICD-10-CM | POA: Diagnosis not present

## 2019-12-20 DIAGNOSIS — J301 Allergic rhinitis due to pollen: Secondary | ICD-10-CM | POA: Diagnosis not present

## 2019-12-20 DIAGNOSIS — R5383 Other fatigue: Secondary | ICD-10-CM | POA: Diagnosis not present

## 2019-12-20 DIAGNOSIS — J4530 Mild persistent asthma, uncomplicated: Secondary | ICD-10-CM | POA: Diagnosis not present

## 2019-12-28 DIAGNOSIS — J449 Chronic obstructive pulmonary disease, unspecified: Secondary | ICD-10-CM | POA: Diagnosis not present

## 2020-01-01 DIAGNOSIS — J449 Chronic obstructive pulmonary disease, unspecified: Secondary | ICD-10-CM | POA: Diagnosis not present

## 2020-01-01 DIAGNOSIS — R0902 Hypoxemia: Secondary | ICD-10-CM | POA: Diagnosis not present

## 2020-01-01 DIAGNOSIS — I509 Heart failure, unspecified: Secondary | ICD-10-CM | POA: Diagnosis not present

## 2020-01-03 DIAGNOSIS — N390 Urinary tract infection, site not specified: Secondary | ICD-10-CM | POA: Diagnosis not present

## 2020-01-03 DIAGNOSIS — R339 Retention of urine, unspecified: Secondary | ICD-10-CM | POA: Diagnosis not present

## 2020-01-09 DIAGNOSIS — J449 Chronic obstructive pulmonary disease, unspecified: Secondary | ICD-10-CM | POA: Diagnosis not present

## 2020-01-14 DIAGNOSIS — I509 Heart failure, unspecified: Secondary | ICD-10-CM | POA: Diagnosis not present

## 2020-01-14 DIAGNOSIS — R0902 Hypoxemia: Secondary | ICD-10-CM | POA: Diagnosis not present

## 2020-01-14 DIAGNOSIS — J449 Chronic obstructive pulmonary disease, unspecified: Secondary | ICD-10-CM | POA: Diagnosis not present

## 2020-01-16 DIAGNOSIS — R109 Unspecified abdominal pain: Secondary | ICD-10-CM | POA: Diagnosis not present

## 2020-01-16 DIAGNOSIS — Z794 Long term (current) use of insulin: Secondary | ICD-10-CM | POA: Diagnosis not present

## 2020-01-16 DIAGNOSIS — E785 Hyperlipidemia, unspecified: Secondary | ICD-10-CM | POA: Diagnosis not present

## 2020-01-16 DIAGNOSIS — I10 Essential (primary) hypertension: Secondary | ICD-10-CM | POA: Diagnosis not present

## 2020-01-16 DIAGNOSIS — E1140 Type 2 diabetes mellitus with diabetic neuropathy, unspecified: Secondary | ICD-10-CM | POA: Diagnosis not present

## 2020-01-16 DIAGNOSIS — E039 Hypothyroidism, unspecified: Secondary | ICD-10-CM | POA: Diagnosis not present

## 2020-01-28 DIAGNOSIS — J449 Chronic obstructive pulmonary disease, unspecified: Secondary | ICD-10-CM | POA: Diagnosis not present

## 2020-02-01 DIAGNOSIS — J449 Chronic obstructive pulmonary disease, unspecified: Secondary | ICD-10-CM | POA: Diagnosis not present

## 2020-02-01 DIAGNOSIS — I509 Heart failure, unspecified: Secondary | ICD-10-CM | POA: Diagnosis not present

## 2020-02-01 DIAGNOSIS — R0902 Hypoxemia: Secondary | ICD-10-CM | POA: Diagnosis not present

## 2020-02-09 DIAGNOSIS — J449 Chronic obstructive pulmonary disease, unspecified: Secondary | ICD-10-CM | POA: Diagnosis not present

## 2020-02-14 DIAGNOSIS — J449 Chronic obstructive pulmonary disease, unspecified: Secondary | ICD-10-CM | POA: Diagnosis not present

## 2020-02-14 DIAGNOSIS — I509 Heart failure, unspecified: Secondary | ICD-10-CM | POA: Diagnosis not present

## 2020-02-14 DIAGNOSIS — R0902 Hypoxemia: Secondary | ICD-10-CM | POA: Diagnosis not present

## 2020-02-27 DIAGNOSIS — J449 Chronic obstructive pulmonary disease, unspecified: Secondary | ICD-10-CM | POA: Diagnosis not present

## 2020-03-02 DIAGNOSIS — R0902 Hypoxemia: Secondary | ICD-10-CM | POA: Diagnosis not present

## 2020-03-02 DIAGNOSIS — I509 Heart failure, unspecified: Secondary | ICD-10-CM | POA: Diagnosis not present

## 2020-03-02 DIAGNOSIS — J449 Chronic obstructive pulmonary disease, unspecified: Secondary | ICD-10-CM | POA: Diagnosis not present

## 2020-03-04 DIAGNOSIS — S31000A Unspecified open wound of lower back and pelvis without penetration into retroperitoneum, initial encounter: Secondary | ICD-10-CM | POA: Diagnosis not present

## 2020-03-04 DIAGNOSIS — L97529 Non-pressure chronic ulcer of other part of left foot with unspecified severity: Secondary | ICD-10-CM | POA: Diagnosis not present

## 2020-03-10 DIAGNOSIS — L97529 Non-pressure chronic ulcer of other part of left foot with unspecified severity: Secondary | ICD-10-CM | POA: Diagnosis not present

## 2020-03-10 DIAGNOSIS — I5032 Chronic diastolic (congestive) heart failure: Secondary | ICD-10-CM | POA: Diagnosis not present

## 2020-03-10 DIAGNOSIS — S31000S Unspecified open wound of lower back and pelvis without penetration into retroperitoneum, sequela: Secondary | ICD-10-CM | POA: Diagnosis not present

## 2020-03-10 DIAGNOSIS — E1140 Type 2 diabetes mellitus with diabetic neuropathy, unspecified: Secondary | ICD-10-CM | POA: Diagnosis not present

## 2020-03-10 DIAGNOSIS — Z794 Long term (current) use of insulin: Secondary | ICD-10-CM | POA: Diagnosis not present

## 2020-03-12 DIAGNOSIS — L97529 Non-pressure chronic ulcer of other part of left foot with unspecified severity: Secondary | ICD-10-CM | POA: Diagnosis not present

## 2020-03-13 DIAGNOSIS — S31000S Unspecified open wound of lower back and pelvis without penetration into retroperitoneum, sequela: Secondary | ICD-10-CM | POA: Diagnosis not present

## 2020-03-13 DIAGNOSIS — Z794 Long term (current) use of insulin: Secondary | ICD-10-CM | POA: Diagnosis not present

## 2020-03-13 DIAGNOSIS — E1140 Type 2 diabetes mellitus with diabetic neuropathy, unspecified: Secondary | ICD-10-CM | POA: Diagnosis not present

## 2020-03-13 DIAGNOSIS — L97529 Non-pressure chronic ulcer of other part of left foot with unspecified severity: Secondary | ICD-10-CM | POA: Diagnosis not present

## 2020-03-13 DIAGNOSIS — J449 Chronic obstructive pulmonary disease, unspecified: Secondary | ICD-10-CM | POA: Diagnosis not present

## 2020-03-15 DIAGNOSIS — J449 Chronic obstructive pulmonary disease, unspecified: Secondary | ICD-10-CM | POA: Diagnosis not present

## 2020-03-15 DIAGNOSIS — I509 Heart failure, unspecified: Secondary | ICD-10-CM | POA: Diagnosis not present

## 2020-03-15 DIAGNOSIS — R0902 Hypoxemia: Secondary | ICD-10-CM | POA: Diagnosis not present

## 2020-03-20 DIAGNOSIS — Z8673 Personal history of transient ischemic attack (TIA), and cerebral infarction without residual deficits: Secondary | ICD-10-CM | POA: Diagnosis not present

## 2020-03-20 DIAGNOSIS — M79672 Pain in left foot: Secondary | ICD-10-CM | POA: Diagnosis not present

## 2020-03-20 DIAGNOSIS — Z882 Allergy status to sulfonamides status: Secondary | ICD-10-CM | POA: Diagnosis not present

## 2020-03-20 DIAGNOSIS — I2510 Atherosclerotic heart disease of native coronary artery without angina pectoris: Secondary | ICD-10-CM | POA: Diagnosis not present

## 2020-03-20 DIAGNOSIS — M79675 Pain in left toe(s): Secondary | ICD-10-CM | POA: Diagnosis not present

## 2020-03-20 DIAGNOSIS — J9611 Chronic respiratory failure with hypoxia: Secondary | ICD-10-CM | POA: Diagnosis not present

## 2020-03-20 DIAGNOSIS — M79605 Pain in left leg: Secondary | ICD-10-CM | POA: Diagnosis not present

## 2020-03-20 DIAGNOSIS — I5032 Chronic diastolic (congestive) heart failure: Secondary | ICD-10-CM | POA: Diagnosis not present

## 2020-03-20 DIAGNOSIS — Z79899 Other long term (current) drug therapy: Secondary | ICD-10-CM | POA: Diagnosis not present

## 2020-03-20 DIAGNOSIS — I252 Old myocardial infarction: Secondary | ICD-10-CM | POA: Diagnosis not present

## 2020-03-20 DIAGNOSIS — Z87440 Personal history of urinary (tract) infections: Secondary | ICD-10-CM | POA: Diagnosis not present

## 2020-03-20 DIAGNOSIS — E1165 Type 2 diabetes mellitus with hyperglycemia: Secondary | ICD-10-CM | POA: Diagnosis not present

## 2020-03-20 DIAGNOSIS — H547 Unspecified visual loss: Secondary | ICD-10-CM | POA: Diagnosis not present

## 2020-03-20 DIAGNOSIS — S92912A Unspecified fracture of left toe(s), initial encounter for closed fracture: Secondary | ICD-10-CM | POA: Diagnosis not present

## 2020-03-20 DIAGNOSIS — J449 Chronic obstructive pulmonary disease, unspecified: Secondary | ICD-10-CM | POA: Diagnosis not present

## 2020-03-20 DIAGNOSIS — L03116 Cellulitis of left lower limb: Secondary | ICD-10-CM | POA: Diagnosis not present

## 2020-03-20 DIAGNOSIS — Z881 Allergy status to other antibiotic agents status: Secondary | ICD-10-CM | POA: Diagnosis not present

## 2020-03-20 DIAGNOSIS — Z95820 Peripheral vascular angioplasty status with implants and grafts: Secondary | ICD-10-CM | POA: Diagnosis not present

## 2020-03-20 DIAGNOSIS — R600 Localized edema: Secondary | ICD-10-CM | POA: Diagnosis not present

## 2020-03-20 DIAGNOSIS — Z888 Allergy status to other drugs, medicaments and biological substances status: Secondary | ICD-10-CM | POA: Diagnosis not present

## 2020-03-20 DIAGNOSIS — Z794 Long term (current) use of insulin: Secondary | ICD-10-CM | POA: Diagnosis not present

## 2020-03-20 DIAGNOSIS — E039 Hypothyroidism, unspecified: Secondary | ICD-10-CM | POA: Diagnosis not present

## 2020-03-20 DIAGNOSIS — Z981 Arthrodesis status: Secondary | ICD-10-CM | POA: Diagnosis not present

## 2020-03-20 DIAGNOSIS — I110 Hypertensive heart disease with heart failure: Secondary | ICD-10-CM | POA: Diagnosis not present

## 2020-03-22 DIAGNOSIS — L03116 Cellulitis of left lower limb: Secondary | ICD-10-CM

## 2020-03-29 DIAGNOSIS — J449 Chronic obstructive pulmonary disease, unspecified: Secondary | ICD-10-CM | POA: Diagnosis not present

## 2020-04-02 DIAGNOSIS — J449 Chronic obstructive pulmonary disease, unspecified: Secondary | ICD-10-CM | POA: Diagnosis not present

## 2020-04-02 DIAGNOSIS — I509 Heart failure, unspecified: Secondary | ICD-10-CM | POA: Diagnosis not present

## 2020-04-02 DIAGNOSIS — R0902 Hypoxemia: Secondary | ICD-10-CM | POA: Diagnosis not present

## 2020-04-14 DIAGNOSIS — L03116 Cellulitis of left lower limb: Secondary | ICD-10-CM | POA: Diagnosis not present

## 2020-04-14 DIAGNOSIS — D649 Anemia, unspecified: Secondary | ICD-10-CM | POA: Diagnosis not present

## 2020-04-14 DIAGNOSIS — E1140 Type 2 diabetes mellitus with diabetic neuropathy, unspecified: Secondary | ICD-10-CM | POA: Diagnosis not present

## 2020-04-14 DIAGNOSIS — E039 Hypothyroidism, unspecified: Secondary | ICD-10-CM | POA: Diagnosis not present

## 2020-04-14 DIAGNOSIS — S92506A Nondisplaced unspecified fracture of unspecified lesser toe(s), initial encounter for closed fracture: Secondary | ICD-10-CM | POA: Diagnosis not present

## 2020-04-14 DIAGNOSIS — M2550 Pain in unspecified joint: Secondary | ICD-10-CM | POA: Diagnosis not present

## 2020-04-14 DIAGNOSIS — Z794 Long term (current) use of insulin: Secondary | ICD-10-CM | POA: Diagnosis not present

## 2020-04-15 DIAGNOSIS — R0902 Hypoxemia: Secondary | ICD-10-CM | POA: Diagnosis not present

## 2020-04-15 DIAGNOSIS — J449 Chronic obstructive pulmonary disease, unspecified: Secondary | ICD-10-CM | POA: Diagnosis not present

## 2020-04-15 DIAGNOSIS — I509 Heart failure, unspecified: Secondary | ICD-10-CM | POA: Diagnosis not present

## 2020-04-29 DIAGNOSIS — J449 Chronic obstructive pulmonary disease, unspecified: Secondary | ICD-10-CM | POA: Diagnosis not present

## 2020-05-03 DIAGNOSIS — J449 Chronic obstructive pulmonary disease, unspecified: Secondary | ICD-10-CM | POA: Diagnosis not present

## 2020-05-03 DIAGNOSIS — I509 Heart failure, unspecified: Secondary | ICD-10-CM | POA: Diagnosis not present

## 2020-05-03 DIAGNOSIS — R0902 Hypoxemia: Secondary | ICD-10-CM | POA: Diagnosis not present

## 2020-05-16 DIAGNOSIS — I509 Heart failure, unspecified: Secondary | ICD-10-CM | POA: Diagnosis not present

## 2020-05-16 DIAGNOSIS — J449 Chronic obstructive pulmonary disease, unspecified: Secondary | ICD-10-CM | POA: Diagnosis not present

## 2020-05-16 DIAGNOSIS — R0902 Hypoxemia: Secondary | ICD-10-CM | POA: Diagnosis not present

## 2020-05-23 DIAGNOSIS — M545 Low back pain: Secondary | ICD-10-CM | POA: Diagnosis not present

## 2020-05-23 DIAGNOSIS — L853 Xerosis cutis: Secondary | ICD-10-CM | POA: Diagnosis not present

## 2020-05-23 DIAGNOSIS — N390 Urinary tract infection, site not specified: Secondary | ICD-10-CM | POA: Diagnosis not present

## 2020-05-23 DIAGNOSIS — L03116 Cellulitis of left lower limb: Secondary | ICD-10-CM | POA: Diagnosis not present

## 2020-05-25 DIAGNOSIS — I2510 Atherosclerotic heart disease of native coronary artery without angina pectoris: Secondary | ICD-10-CM | POA: Diagnosis not present

## 2020-05-25 DIAGNOSIS — E11621 Type 2 diabetes mellitus with foot ulcer: Secondary | ICD-10-CM | POA: Diagnosis not present

## 2020-05-25 DIAGNOSIS — K219 Gastro-esophageal reflux disease without esophagitis: Secondary | ICD-10-CM | POA: Diagnosis not present

## 2020-05-25 DIAGNOSIS — E1165 Type 2 diabetes mellitus with hyperglycemia: Secondary | ICD-10-CM | POA: Diagnosis not present

## 2020-05-25 DIAGNOSIS — A419 Sepsis, unspecified organism: Secondary | ICD-10-CM | POA: Diagnosis not present

## 2020-05-25 DIAGNOSIS — Z743 Need for continuous supervision: Secondary | ICD-10-CM | POA: Diagnosis not present

## 2020-05-25 DIAGNOSIS — R0689 Other abnormalities of breathing: Secondary | ICD-10-CM | POA: Diagnosis not present

## 2020-05-25 DIAGNOSIS — J441 Chronic obstructive pulmonary disease with (acute) exacerbation: Secondary | ICD-10-CM | POA: Diagnosis not present

## 2020-05-25 DIAGNOSIS — J9622 Acute and chronic respiratory failure with hypercapnia: Secondary | ICD-10-CM | POA: Diagnosis not present

## 2020-05-25 DIAGNOSIS — J9621 Acute and chronic respiratory failure with hypoxia: Secondary | ICD-10-CM | POA: Diagnosis not present

## 2020-05-25 DIAGNOSIS — L97429 Non-pressure chronic ulcer of left heel and midfoot with unspecified severity: Secondary | ICD-10-CM | POA: Diagnosis not present

## 2020-05-25 DIAGNOSIS — N3090 Cystitis, unspecified without hematuria: Secondary | ICD-10-CM | POA: Diagnosis not present

## 2020-05-25 DIAGNOSIS — J9612 Chronic respiratory failure with hypercapnia: Secondary | ICD-10-CM | POA: Diagnosis not present

## 2020-05-25 DIAGNOSIS — E1100 Type 2 diabetes mellitus with hyperosmolarity without nonketotic hyperglycemic-hyperosmolar coma (NKHHC): Secondary | ICD-10-CM | POA: Diagnosis not present

## 2020-05-25 DIAGNOSIS — R451 Restlessness and agitation: Secondary | ICD-10-CM | POA: Diagnosis not present

## 2020-05-25 DIAGNOSIS — R739 Hyperglycemia, unspecified: Secondary | ICD-10-CM | POA: Diagnosis not present

## 2020-05-25 DIAGNOSIS — Z87891 Personal history of nicotine dependence: Secondary | ICD-10-CM | POA: Diagnosis not present

## 2020-05-25 DIAGNOSIS — I5033 Acute on chronic diastolic (congestive) heart failure: Secondary | ICD-10-CM | POA: Diagnosis not present

## 2020-05-25 DIAGNOSIS — D638 Anemia in other chronic diseases classified elsewhere: Secondary | ICD-10-CM | POA: Diagnosis not present

## 2020-05-25 DIAGNOSIS — R069 Unspecified abnormalities of breathing: Secondary | ICD-10-CM | POA: Diagnosis not present

## 2020-05-25 DIAGNOSIS — N179 Acute kidney failure, unspecified: Secondary | ICD-10-CM | POA: Diagnosis not present

## 2020-05-25 DIAGNOSIS — E1151 Type 2 diabetes mellitus with diabetic peripheral angiopathy without gangrene: Secondary | ICD-10-CM | POA: Diagnosis not present

## 2020-05-25 DIAGNOSIS — I5032 Chronic diastolic (congestive) heart failure: Secondary | ICD-10-CM | POA: Diagnosis not present

## 2020-05-25 DIAGNOSIS — J810 Acute pulmonary edema: Secondary | ICD-10-CM | POA: Diagnosis not present

## 2020-05-25 DIAGNOSIS — R6520 Severe sepsis without septic shock: Secondary | ICD-10-CM | POA: Diagnosis not present

## 2020-05-25 DIAGNOSIS — J440 Chronic obstructive pulmonary disease with acute lower respiratory infection: Secondary | ICD-10-CM | POA: Diagnosis not present

## 2020-05-25 DIAGNOSIS — J449 Chronic obstructive pulmonary disease, unspecified: Secondary | ICD-10-CM | POA: Diagnosis not present

## 2020-05-25 DIAGNOSIS — E039 Hypothyroidism, unspecified: Secondary | ICD-10-CM | POA: Diagnosis not present

## 2020-05-25 DIAGNOSIS — E785 Hyperlipidemia, unspecified: Secondary | ICD-10-CM | POA: Diagnosis not present

## 2020-05-25 DIAGNOSIS — I491 Atrial premature depolarization: Secondary | ICD-10-CM | POA: Diagnosis not present

## 2020-05-25 DIAGNOSIS — R0602 Shortness of breath: Secondary | ICD-10-CM | POA: Diagnosis not present

## 2020-05-25 DIAGNOSIS — R600 Localized edema: Secondary | ICD-10-CM | POA: Diagnosis not present

## 2020-05-25 DIAGNOSIS — I5030 Unspecified diastolic (congestive) heart failure: Secondary | ICD-10-CM | POA: Diagnosis not present

## 2020-05-25 DIAGNOSIS — I110 Hypertensive heart disease with heart failure: Secondary | ICD-10-CM | POA: Diagnosis not present

## 2020-05-26 DIAGNOSIS — J9621 Acute and chronic respiratory failure with hypoxia: Secondary | ICD-10-CM | POA: Diagnosis not present

## 2020-05-26 DIAGNOSIS — J9612 Chronic respiratory failure with hypercapnia: Secondary | ICD-10-CM | POA: Diagnosis not present

## 2020-05-26 DIAGNOSIS — J810 Acute pulmonary edema: Secondary | ICD-10-CM | POA: Diagnosis not present

## 2020-05-26 DIAGNOSIS — J441 Chronic obstructive pulmonary disease with (acute) exacerbation: Secondary | ICD-10-CM | POA: Diagnosis not present

## 2020-05-26 DIAGNOSIS — I5032 Chronic diastolic (congestive) heart failure: Secondary | ICD-10-CM | POA: Diagnosis not present

## 2020-05-27 DIAGNOSIS — J9621 Acute and chronic respiratory failure with hypoxia: Secondary | ICD-10-CM | POA: Diagnosis not present

## 2020-05-27 DIAGNOSIS — N3090 Cystitis, unspecified without hematuria: Secondary | ICD-10-CM | POA: Diagnosis not present

## 2020-05-27 DIAGNOSIS — R739 Hyperglycemia, unspecified: Secondary | ICD-10-CM | POA: Diagnosis not present

## 2020-05-27 DIAGNOSIS — J9622 Acute and chronic respiratory failure with hypercapnia: Secondary | ICD-10-CM | POA: Diagnosis not present

## 2020-05-27 DIAGNOSIS — N179 Acute kidney failure, unspecified: Secondary | ICD-10-CM | POA: Diagnosis not present

## 2020-05-28 DIAGNOSIS — N179 Acute kidney failure, unspecified: Secondary | ICD-10-CM | POA: Diagnosis not present

## 2020-05-28 DIAGNOSIS — J9622 Acute and chronic respiratory failure with hypercapnia: Secondary | ICD-10-CM | POA: Diagnosis not present

## 2020-05-28 DIAGNOSIS — R739 Hyperglycemia, unspecified: Secondary | ICD-10-CM | POA: Diagnosis not present

## 2020-05-28 DIAGNOSIS — N3090 Cystitis, unspecified without hematuria: Secondary | ICD-10-CM | POA: Diagnosis not present

## 2020-05-28 DIAGNOSIS — J9621 Acute and chronic respiratory failure with hypoxia: Secondary | ICD-10-CM | POA: Diagnosis not present

## 2020-05-29 DIAGNOSIS — J9621 Acute and chronic respiratory failure with hypoxia: Secondary | ICD-10-CM | POA: Diagnosis not present

## 2020-05-29 DIAGNOSIS — J9622 Acute and chronic respiratory failure with hypercapnia: Secondary | ICD-10-CM | POA: Diagnosis not present

## 2020-05-29 DIAGNOSIS — J449 Chronic obstructive pulmonary disease, unspecified: Secondary | ICD-10-CM | POA: Diagnosis not present

## 2020-05-29 DIAGNOSIS — I5030 Unspecified diastolic (congestive) heart failure: Secondary | ICD-10-CM | POA: Diagnosis not present

## 2020-05-29 DIAGNOSIS — J441 Chronic obstructive pulmonary disease with (acute) exacerbation: Secondary | ICD-10-CM | POA: Diagnosis not present

## 2020-05-30 ENCOUNTER — Ambulatory Visit: Payer: Medicare Other | Admitting: Sports Medicine

## 2020-05-30 DIAGNOSIS — I5030 Unspecified diastolic (congestive) heart failure: Secondary | ICD-10-CM | POA: Diagnosis not present

## 2020-05-30 DIAGNOSIS — J9621 Acute and chronic respiratory failure with hypoxia: Secondary | ICD-10-CM | POA: Diagnosis not present

## 2020-05-30 DIAGNOSIS — J441 Chronic obstructive pulmonary disease with (acute) exacerbation: Secondary | ICD-10-CM | POA: Diagnosis not present

## 2020-05-30 DIAGNOSIS — J9622 Acute and chronic respiratory failure with hypercapnia: Secondary | ICD-10-CM | POA: Diagnosis not present

## 2020-06-02 DIAGNOSIS — R0902 Hypoxemia: Secondary | ICD-10-CM | POA: Diagnosis not present

## 2020-06-02 DIAGNOSIS — I509 Heart failure, unspecified: Secondary | ICD-10-CM | POA: Diagnosis not present

## 2020-06-02 DIAGNOSIS — J449 Chronic obstructive pulmonary disease, unspecified: Secondary | ICD-10-CM | POA: Diagnosis not present

## 2020-06-03 DIAGNOSIS — Z87891 Personal history of nicotine dependence: Secondary | ICD-10-CM | POA: Diagnosis not present

## 2020-06-03 DIAGNOSIS — E1165 Type 2 diabetes mellitus with hyperglycemia: Secondary | ICD-10-CM | POA: Diagnosis not present

## 2020-06-03 DIAGNOSIS — N390 Urinary tract infection, site not specified: Secondary | ICD-10-CM | POA: Diagnosis not present

## 2020-06-03 DIAGNOSIS — Z794 Long term (current) use of insulin: Secondary | ICD-10-CM | POA: Diagnosis not present

## 2020-06-03 DIAGNOSIS — J441 Chronic obstructive pulmonary disease with (acute) exacerbation: Secondary | ICD-10-CM | POA: Diagnosis not present

## 2020-06-03 DIAGNOSIS — E039 Hypothyroidism, unspecified: Secondary | ICD-10-CM | POA: Diagnosis not present

## 2020-06-03 DIAGNOSIS — Z7951 Long term (current) use of inhaled steroids: Secondary | ICD-10-CM | POA: Diagnosis not present

## 2020-06-03 DIAGNOSIS — Z9981 Dependence on supplemental oxygen: Secondary | ICD-10-CM | POA: Diagnosis not present

## 2020-06-03 DIAGNOSIS — F32A Depression, unspecified: Secondary | ICD-10-CM | POA: Diagnosis not present

## 2020-06-03 DIAGNOSIS — E1151 Type 2 diabetes mellitus with diabetic peripheral angiopathy without gangrene: Secondary | ICD-10-CM | POA: Diagnosis not present

## 2020-06-03 DIAGNOSIS — L89322 Pressure ulcer of left buttock, stage 2: Secondary | ICD-10-CM | POA: Diagnosis not present

## 2020-06-03 DIAGNOSIS — Z7902 Long term (current) use of antithrombotics/antiplatelets: Secondary | ICD-10-CM | POA: Diagnosis not present

## 2020-06-03 DIAGNOSIS — I2510 Atherosclerotic heart disease of native coronary artery without angina pectoris: Secondary | ICD-10-CM | POA: Diagnosis not present

## 2020-06-03 DIAGNOSIS — J9621 Acute and chronic respiratory failure with hypoxia: Secondary | ICD-10-CM | POA: Diagnosis not present

## 2020-06-03 DIAGNOSIS — N179 Acute kidney failure, unspecified: Secondary | ICD-10-CM | POA: Diagnosis not present

## 2020-06-03 DIAGNOSIS — I5033 Acute on chronic diastolic (congestive) heart failure: Secondary | ICD-10-CM | POA: Diagnosis not present

## 2020-06-03 DIAGNOSIS — L89312 Pressure ulcer of right buttock, stage 2: Secondary | ICD-10-CM | POA: Diagnosis not present

## 2020-06-03 DIAGNOSIS — I252 Old myocardial infarction: Secondary | ICD-10-CM | POA: Diagnosis not present

## 2020-06-03 DIAGNOSIS — E785 Hyperlipidemia, unspecified: Secondary | ICD-10-CM | POA: Diagnosis not present

## 2020-06-03 DIAGNOSIS — J440 Chronic obstructive pulmonary disease with acute lower respiratory infection: Secondary | ICD-10-CM | POA: Diagnosis not present

## 2020-06-03 DIAGNOSIS — I110 Hypertensive heart disease with heart failure: Secondary | ICD-10-CM | POA: Diagnosis not present

## 2020-06-03 DIAGNOSIS — J9622 Acute and chronic respiratory failure with hypercapnia: Secondary | ICD-10-CM | POA: Diagnosis not present

## 2020-06-05 DIAGNOSIS — Z9181 History of falling: Secondary | ICD-10-CM | POA: Diagnosis not present

## 2020-06-05 DIAGNOSIS — J449 Chronic obstructive pulmonary disease, unspecified: Secondary | ICD-10-CM | POA: Diagnosis not present

## 2020-06-05 DIAGNOSIS — I5032 Chronic diastolic (congestive) heart failure: Secondary | ICD-10-CM | POA: Diagnosis not present

## 2020-06-06 DIAGNOSIS — L89312 Pressure ulcer of right buttock, stage 2: Secondary | ICD-10-CM | POA: Diagnosis not present

## 2020-06-06 DIAGNOSIS — N179 Acute kidney failure, unspecified: Secondary | ICD-10-CM | POA: Diagnosis not present

## 2020-06-06 DIAGNOSIS — I2510 Atherosclerotic heart disease of native coronary artery without angina pectoris: Secondary | ICD-10-CM | POA: Diagnosis not present

## 2020-06-06 DIAGNOSIS — N390 Urinary tract infection, site not specified: Secondary | ICD-10-CM | POA: Diagnosis not present

## 2020-06-06 DIAGNOSIS — F32A Depression, unspecified: Secondary | ICD-10-CM | POA: Diagnosis not present

## 2020-06-06 DIAGNOSIS — L89322 Pressure ulcer of left buttock, stage 2: Secondary | ICD-10-CM | POA: Diagnosis not present

## 2020-06-06 DIAGNOSIS — J9621 Acute and chronic respiratory failure with hypoxia: Secondary | ICD-10-CM | POA: Diagnosis not present

## 2020-06-06 DIAGNOSIS — I110 Hypertensive heart disease with heart failure: Secondary | ICD-10-CM | POA: Diagnosis not present

## 2020-06-06 DIAGNOSIS — I252 Old myocardial infarction: Secondary | ICD-10-CM | POA: Diagnosis not present

## 2020-06-06 DIAGNOSIS — E1165 Type 2 diabetes mellitus with hyperglycemia: Secondary | ICD-10-CM | POA: Diagnosis not present

## 2020-06-06 DIAGNOSIS — J440 Chronic obstructive pulmonary disease with acute lower respiratory infection: Secondary | ICD-10-CM | POA: Diagnosis not present

## 2020-06-06 DIAGNOSIS — Z7951 Long term (current) use of inhaled steroids: Secondary | ICD-10-CM | POA: Diagnosis not present

## 2020-06-06 DIAGNOSIS — J441 Chronic obstructive pulmonary disease with (acute) exacerbation: Secondary | ICD-10-CM | POA: Diagnosis not present

## 2020-06-06 DIAGNOSIS — E785 Hyperlipidemia, unspecified: Secondary | ICD-10-CM | POA: Diagnosis not present

## 2020-06-06 DIAGNOSIS — Z794 Long term (current) use of insulin: Secondary | ICD-10-CM | POA: Diagnosis not present

## 2020-06-06 DIAGNOSIS — I5033 Acute on chronic diastolic (congestive) heart failure: Secondary | ICD-10-CM | POA: Diagnosis not present

## 2020-06-06 DIAGNOSIS — Z87891 Personal history of nicotine dependence: Secondary | ICD-10-CM | POA: Diagnosis not present

## 2020-06-06 DIAGNOSIS — J9622 Acute and chronic respiratory failure with hypercapnia: Secondary | ICD-10-CM | POA: Diagnosis not present

## 2020-06-06 DIAGNOSIS — Z9981 Dependence on supplemental oxygen: Secondary | ICD-10-CM | POA: Diagnosis not present

## 2020-06-06 DIAGNOSIS — E1151 Type 2 diabetes mellitus with diabetic peripheral angiopathy without gangrene: Secondary | ICD-10-CM | POA: Diagnosis not present

## 2020-06-06 DIAGNOSIS — E039 Hypothyroidism, unspecified: Secondary | ICD-10-CM | POA: Diagnosis not present

## 2020-06-06 DIAGNOSIS — Z7902 Long term (current) use of antithrombotics/antiplatelets: Secondary | ICD-10-CM | POA: Diagnosis not present

## 2020-06-10 DIAGNOSIS — Z7951 Long term (current) use of inhaled steroids: Secondary | ICD-10-CM | POA: Diagnosis not present

## 2020-06-10 DIAGNOSIS — L89322 Pressure ulcer of left buttock, stage 2: Secondary | ICD-10-CM | POA: Diagnosis not present

## 2020-06-10 DIAGNOSIS — F32A Depression, unspecified: Secondary | ICD-10-CM | POA: Diagnosis not present

## 2020-06-10 DIAGNOSIS — N390 Urinary tract infection, site not specified: Secondary | ICD-10-CM | POA: Diagnosis not present

## 2020-06-10 DIAGNOSIS — J440 Chronic obstructive pulmonary disease with acute lower respiratory infection: Secondary | ICD-10-CM | POA: Diagnosis not present

## 2020-06-10 DIAGNOSIS — Z7902 Long term (current) use of antithrombotics/antiplatelets: Secondary | ICD-10-CM | POA: Diagnosis not present

## 2020-06-10 DIAGNOSIS — Z9981 Dependence on supplemental oxygen: Secondary | ICD-10-CM | POA: Diagnosis not present

## 2020-06-10 DIAGNOSIS — Z87891 Personal history of nicotine dependence: Secondary | ICD-10-CM | POA: Diagnosis not present

## 2020-06-10 DIAGNOSIS — J9621 Acute and chronic respiratory failure with hypoxia: Secondary | ICD-10-CM | POA: Diagnosis not present

## 2020-06-10 DIAGNOSIS — Z794 Long term (current) use of insulin: Secondary | ICD-10-CM | POA: Diagnosis not present

## 2020-06-10 DIAGNOSIS — E1165 Type 2 diabetes mellitus with hyperglycemia: Secondary | ICD-10-CM | POA: Diagnosis not present

## 2020-06-10 DIAGNOSIS — J9622 Acute and chronic respiratory failure with hypercapnia: Secondary | ICD-10-CM | POA: Diagnosis not present

## 2020-06-10 DIAGNOSIS — I2510 Atherosclerotic heart disease of native coronary artery without angina pectoris: Secondary | ICD-10-CM | POA: Diagnosis not present

## 2020-06-10 DIAGNOSIS — I252 Old myocardial infarction: Secondary | ICD-10-CM | POA: Diagnosis not present

## 2020-06-10 DIAGNOSIS — J441 Chronic obstructive pulmonary disease with (acute) exacerbation: Secondary | ICD-10-CM | POA: Diagnosis not present

## 2020-06-10 DIAGNOSIS — E039 Hypothyroidism, unspecified: Secondary | ICD-10-CM | POA: Diagnosis not present

## 2020-06-10 DIAGNOSIS — I110 Hypertensive heart disease with heart failure: Secondary | ICD-10-CM | POA: Diagnosis not present

## 2020-06-10 DIAGNOSIS — E785 Hyperlipidemia, unspecified: Secondary | ICD-10-CM | POA: Diagnosis not present

## 2020-06-10 DIAGNOSIS — E1151 Type 2 diabetes mellitus with diabetic peripheral angiopathy without gangrene: Secondary | ICD-10-CM | POA: Diagnosis not present

## 2020-06-10 DIAGNOSIS — L89312 Pressure ulcer of right buttock, stage 2: Secondary | ICD-10-CM | POA: Diagnosis not present

## 2020-06-10 DIAGNOSIS — I5033 Acute on chronic diastolic (congestive) heart failure: Secondary | ICD-10-CM | POA: Diagnosis not present

## 2020-06-10 DIAGNOSIS — N179 Acute kidney failure, unspecified: Secondary | ICD-10-CM | POA: Diagnosis not present

## 2020-06-13 DIAGNOSIS — Z7951 Long term (current) use of inhaled steroids: Secondary | ICD-10-CM | POA: Diagnosis not present

## 2020-06-13 DIAGNOSIS — E785 Hyperlipidemia, unspecified: Secondary | ICD-10-CM | POA: Diagnosis not present

## 2020-06-13 DIAGNOSIS — F32A Depression, unspecified: Secondary | ICD-10-CM | POA: Diagnosis not present

## 2020-06-13 DIAGNOSIS — I2510 Atherosclerotic heart disease of native coronary artery without angina pectoris: Secondary | ICD-10-CM | POA: Diagnosis not present

## 2020-06-13 DIAGNOSIS — E1151 Type 2 diabetes mellitus with diabetic peripheral angiopathy without gangrene: Secondary | ICD-10-CM | POA: Diagnosis not present

## 2020-06-13 DIAGNOSIS — L89322 Pressure ulcer of left buttock, stage 2: Secondary | ICD-10-CM | POA: Diagnosis not present

## 2020-06-13 DIAGNOSIS — E039 Hypothyroidism, unspecified: Secondary | ICD-10-CM | POA: Diagnosis not present

## 2020-06-13 DIAGNOSIS — N390 Urinary tract infection, site not specified: Secondary | ICD-10-CM | POA: Diagnosis not present

## 2020-06-13 DIAGNOSIS — J9621 Acute and chronic respiratory failure with hypoxia: Secondary | ICD-10-CM | POA: Diagnosis not present

## 2020-06-13 DIAGNOSIS — I110 Hypertensive heart disease with heart failure: Secondary | ICD-10-CM | POA: Diagnosis not present

## 2020-06-13 DIAGNOSIS — Z794 Long term (current) use of insulin: Secondary | ICD-10-CM | POA: Diagnosis not present

## 2020-06-13 DIAGNOSIS — J441 Chronic obstructive pulmonary disease with (acute) exacerbation: Secondary | ICD-10-CM | POA: Diagnosis not present

## 2020-06-13 DIAGNOSIS — I252 Old myocardial infarction: Secondary | ICD-10-CM | POA: Diagnosis not present

## 2020-06-13 DIAGNOSIS — Z87891 Personal history of nicotine dependence: Secondary | ICD-10-CM | POA: Diagnosis not present

## 2020-06-13 DIAGNOSIS — Z9981 Dependence on supplemental oxygen: Secondary | ICD-10-CM | POA: Diagnosis not present

## 2020-06-13 DIAGNOSIS — J9622 Acute and chronic respiratory failure with hypercapnia: Secondary | ICD-10-CM | POA: Diagnosis not present

## 2020-06-13 DIAGNOSIS — Z7902 Long term (current) use of antithrombotics/antiplatelets: Secondary | ICD-10-CM | POA: Diagnosis not present

## 2020-06-13 DIAGNOSIS — N179 Acute kidney failure, unspecified: Secondary | ICD-10-CM | POA: Diagnosis not present

## 2020-06-13 DIAGNOSIS — L89312 Pressure ulcer of right buttock, stage 2: Secondary | ICD-10-CM | POA: Diagnosis not present

## 2020-06-13 DIAGNOSIS — E1165 Type 2 diabetes mellitus with hyperglycemia: Secondary | ICD-10-CM | POA: Diagnosis not present

## 2020-06-13 DIAGNOSIS — I5033 Acute on chronic diastolic (congestive) heart failure: Secondary | ICD-10-CM | POA: Diagnosis not present

## 2020-06-13 DIAGNOSIS — J440 Chronic obstructive pulmonary disease with acute lower respiratory infection: Secondary | ICD-10-CM | POA: Diagnosis not present

## 2020-06-15 DIAGNOSIS — I509 Heart failure, unspecified: Secondary | ICD-10-CM | POA: Diagnosis not present

## 2020-06-15 DIAGNOSIS — R0902 Hypoxemia: Secondary | ICD-10-CM | POA: Diagnosis not present

## 2020-06-15 DIAGNOSIS — J449 Chronic obstructive pulmonary disease, unspecified: Secondary | ICD-10-CM | POA: Diagnosis not present

## 2020-06-16 ENCOUNTER — Encounter: Payer: Self-pay | Admitting: *Deleted

## 2020-06-16 ENCOUNTER — Encounter: Payer: Self-pay | Admitting: Cardiology

## 2020-06-16 DIAGNOSIS — Z9981 Dependence on supplemental oxygen: Secondary | ICD-10-CM | POA: Diagnosis not present

## 2020-06-16 DIAGNOSIS — J441 Chronic obstructive pulmonary disease with (acute) exacerbation: Secondary | ICD-10-CM | POA: Diagnosis not present

## 2020-06-16 DIAGNOSIS — E785 Hyperlipidemia, unspecified: Secondary | ICD-10-CM | POA: Diagnosis not present

## 2020-06-16 DIAGNOSIS — N390 Urinary tract infection, site not specified: Secondary | ICD-10-CM | POA: Diagnosis not present

## 2020-06-16 DIAGNOSIS — E039 Hypothyroidism, unspecified: Secondary | ICD-10-CM | POA: Diagnosis not present

## 2020-06-16 DIAGNOSIS — N179 Acute kidney failure, unspecified: Secondary | ICD-10-CM | POA: Diagnosis not present

## 2020-06-16 DIAGNOSIS — L89312 Pressure ulcer of right buttock, stage 2: Secondary | ICD-10-CM | POA: Diagnosis not present

## 2020-06-16 DIAGNOSIS — I5033 Acute on chronic diastolic (congestive) heart failure: Secondary | ICD-10-CM | POA: Diagnosis not present

## 2020-06-16 DIAGNOSIS — Z7902 Long term (current) use of antithrombotics/antiplatelets: Secondary | ICD-10-CM | POA: Diagnosis not present

## 2020-06-16 DIAGNOSIS — I110 Hypertensive heart disease with heart failure: Secondary | ICD-10-CM | POA: Diagnosis not present

## 2020-06-16 DIAGNOSIS — Z7951 Long term (current) use of inhaled steroids: Secondary | ICD-10-CM | POA: Diagnosis not present

## 2020-06-16 DIAGNOSIS — J9621 Acute and chronic respiratory failure with hypoxia: Secondary | ICD-10-CM | POA: Diagnosis not present

## 2020-06-16 DIAGNOSIS — Z87891 Personal history of nicotine dependence: Secondary | ICD-10-CM | POA: Diagnosis not present

## 2020-06-16 DIAGNOSIS — J440 Chronic obstructive pulmonary disease with acute lower respiratory infection: Secondary | ICD-10-CM | POA: Diagnosis not present

## 2020-06-16 DIAGNOSIS — J9622 Acute and chronic respiratory failure with hypercapnia: Secondary | ICD-10-CM | POA: Diagnosis not present

## 2020-06-16 DIAGNOSIS — I252 Old myocardial infarction: Secondary | ICD-10-CM | POA: Diagnosis not present

## 2020-06-16 DIAGNOSIS — I639 Cerebral infarction, unspecified: Secondary | ICD-10-CM

## 2020-06-16 DIAGNOSIS — E1165 Type 2 diabetes mellitus with hyperglycemia: Secondary | ICD-10-CM | POA: Diagnosis not present

## 2020-06-16 DIAGNOSIS — F32A Depression, unspecified: Secondary | ICD-10-CM | POA: Diagnosis not present

## 2020-06-16 DIAGNOSIS — L89322 Pressure ulcer of left buttock, stage 2: Secondary | ICD-10-CM | POA: Diagnosis not present

## 2020-06-16 DIAGNOSIS — Z794 Long term (current) use of insulin: Secondary | ICD-10-CM | POA: Diagnosis not present

## 2020-06-16 DIAGNOSIS — I2510 Atherosclerotic heart disease of native coronary artery without angina pectoris: Secondary | ICD-10-CM | POA: Diagnosis not present

## 2020-06-16 DIAGNOSIS — E1151 Type 2 diabetes mellitus with diabetic peripheral angiopathy without gangrene: Secondary | ICD-10-CM | POA: Diagnosis not present

## 2020-06-16 HISTORY — DX: Cerebral infarction, unspecified: I63.9

## 2020-06-18 DIAGNOSIS — F32A Depression, unspecified: Secondary | ICD-10-CM | POA: Diagnosis not present

## 2020-06-18 DIAGNOSIS — J9622 Acute and chronic respiratory failure with hypercapnia: Secondary | ICD-10-CM | POA: Diagnosis not present

## 2020-06-18 DIAGNOSIS — N390 Urinary tract infection, site not specified: Secondary | ICD-10-CM | POA: Diagnosis not present

## 2020-06-18 DIAGNOSIS — J441 Chronic obstructive pulmonary disease with (acute) exacerbation: Secondary | ICD-10-CM | POA: Diagnosis not present

## 2020-06-18 DIAGNOSIS — Z9981 Dependence on supplemental oxygen: Secondary | ICD-10-CM | POA: Diagnosis not present

## 2020-06-18 DIAGNOSIS — L89322 Pressure ulcer of left buttock, stage 2: Secondary | ICD-10-CM | POA: Diagnosis not present

## 2020-06-18 DIAGNOSIS — L89312 Pressure ulcer of right buttock, stage 2: Secondary | ICD-10-CM | POA: Diagnosis not present

## 2020-06-18 DIAGNOSIS — N179 Acute kidney failure, unspecified: Secondary | ICD-10-CM | POA: Diagnosis not present

## 2020-06-18 DIAGNOSIS — E785 Hyperlipidemia, unspecified: Secondary | ICD-10-CM | POA: Diagnosis not present

## 2020-06-18 DIAGNOSIS — Z794 Long term (current) use of insulin: Secondary | ICD-10-CM | POA: Diagnosis not present

## 2020-06-18 DIAGNOSIS — J9621 Acute and chronic respiratory failure with hypoxia: Secondary | ICD-10-CM | POA: Diagnosis not present

## 2020-06-18 DIAGNOSIS — Z7902 Long term (current) use of antithrombotics/antiplatelets: Secondary | ICD-10-CM | POA: Diagnosis not present

## 2020-06-18 DIAGNOSIS — I252 Old myocardial infarction: Secondary | ICD-10-CM | POA: Diagnosis not present

## 2020-06-18 DIAGNOSIS — I110 Hypertensive heart disease with heart failure: Secondary | ICD-10-CM | POA: Diagnosis not present

## 2020-06-18 DIAGNOSIS — Z7951 Long term (current) use of inhaled steroids: Secondary | ICD-10-CM | POA: Diagnosis not present

## 2020-06-18 DIAGNOSIS — E039 Hypothyroidism, unspecified: Secondary | ICD-10-CM | POA: Diagnosis not present

## 2020-06-18 DIAGNOSIS — E1165 Type 2 diabetes mellitus with hyperglycemia: Secondary | ICD-10-CM | POA: Diagnosis not present

## 2020-06-18 DIAGNOSIS — I2510 Atherosclerotic heart disease of native coronary artery without angina pectoris: Secondary | ICD-10-CM | POA: Diagnosis not present

## 2020-06-18 DIAGNOSIS — J440 Chronic obstructive pulmonary disease with acute lower respiratory infection: Secondary | ICD-10-CM | POA: Diagnosis not present

## 2020-06-18 DIAGNOSIS — Z87891 Personal history of nicotine dependence: Secondary | ICD-10-CM | POA: Diagnosis not present

## 2020-06-18 DIAGNOSIS — E1151 Type 2 diabetes mellitus with diabetic peripheral angiopathy without gangrene: Secondary | ICD-10-CM | POA: Diagnosis not present

## 2020-06-18 DIAGNOSIS — I5033 Acute on chronic diastolic (congestive) heart failure: Secondary | ICD-10-CM | POA: Diagnosis not present

## 2020-06-19 DIAGNOSIS — Z9981 Dependence on supplemental oxygen: Secondary | ICD-10-CM | POA: Diagnosis not present

## 2020-06-19 DIAGNOSIS — I2510 Atherosclerotic heart disease of native coronary artery without angina pectoris: Secondary | ICD-10-CM | POA: Diagnosis not present

## 2020-06-19 DIAGNOSIS — Z7902 Long term (current) use of antithrombotics/antiplatelets: Secondary | ICD-10-CM | POA: Diagnosis not present

## 2020-06-19 DIAGNOSIS — I252 Old myocardial infarction: Secondary | ICD-10-CM | POA: Diagnosis not present

## 2020-06-19 DIAGNOSIS — J440 Chronic obstructive pulmonary disease with acute lower respiratory infection: Secondary | ICD-10-CM | POA: Diagnosis not present

## 2020-06-19 DIAGNOSIS — N179 Acute kidney failure, unspecified: Secondary | ICD-10-CM | POA: Diagnosis not present

## 2020-06-19 DIAGNOSIS — J9622 Acute and chronic respiratory failure with hypercapnia: Secondary | ICD-10-CM | POA: Diagnosis not present

## 2020-06-19 DIAGNOSIS — E1151 Type 2 diabetes mellitus with diabetic peripheral angiopathy without gangrene: Secondary | ICD-10-CM | POA: Diagnosis not present

## 2020-06-19 DIAGNOSIS — E785 Hyperlipidemia, unspecified: Secondary | ICD-10-CM | POA: Diagnosis not present

## 2020-06-19 DIAGNOSIS — Z794 Long term (current) use of insulin: Secondary | ICD-10-CM | POA: Diagnosis not present

## 2020-06-19 DIAGNOSIS — J441 Chronic obstructive pulmonary disease with (acute) exacerbation: Secondary | ICD-10-CM | POA: Diagnosis not present

## 2020-06-19 DIAGNOSIS — E039 Hypothyroidism, unspecified: Secondary | ICD-10-CM | POA: Diagnosis not present

## 2020-06-19 DIAGNOSIS — I110 Hypertensive heart disease with heart failure: Secondary | ICD-10-CM | POA: Diagnosis not present

## 2020-06-19 DIAGNOSIS — F32A Depression, unspecified: Secondary | ICD-10-CM | POA: Diagnosis not present

## 2020-06-19 DIAGNOSIS — Z7951 Long term (current) use of inhaled steroids: Secondary | ICD-10-CM | POA: Diagnosis not present

## 2020-06-19 DIAGNOSIS — L89322 Pressure ulcer of left buttock, stage 2: Secondary | ICD-10-CM | POA: Diagnosis not present

## 2020-06-19 DIAGNOSIS — N390 Urinary tract infection, site not specified: Secondary | ICD-10-CM | POA: Diagnosis not present

## 2020-06-19 DIAGNOSIS — L89312 Pressure ulcer of right buttock, stage 2: Secondary | ICD-10-CM | POA: Diagnosis not present

## 2020-06-19 DIAGNOSIS — I5033 Acute on chronic diastolic (congestive) heart failure: Secondary | ICD-10-CM | POA: Diagnosis not present

## 2020-06-19 DIAGNOSIS — E1165 Type 2 diabetes mellitus with hyperglycemia: Secondary | ICD-10-CM | POA: Diagnosis not present

## 2020-06-19 DIAGNOSIS — J9621 Acute and chronic respiratory failure with hypoxia: Secondary | ICD-10-CM | POA: Diagnosis not present

## 2020-06-19 DIAGNOSIS — Z87891 Personal history of nicotine dependence: Secondary | ICD-10-CM | POA: Diagnosis not present

## 2020-06-23 DIAGNOSIS — E1140 Type 2 diabetes mellitus with diabetic neuropathy, unspecified: Secondary | ICD-10-CM | POA: Diagnosis not present

## 2020-06-23 DIAGNOSIS — E785 Hyperlipidemia, unspecified: Secondary | ICD-10-CM | POA: Diagnosis not present

## 2020-06-23 DIAGNOSIS — I5032 Chronic diastolic (congestive) heart failure: Secondary | ICD-10-CM | POA: Diagnosis not present

## 2020-06-23 DIAGNOSIS — E039 Hypothyroidism, unspecified: Secondary | ICD-10-CM | POA: Diagnosis not present

## 2020-06-23 DIAGNOSIS — Z794 Long term (current) use of insulin: Secondary | ICD-10-CM | POA: Diagnosis not present

## 2020-06-23 DIAGNOSIS — Z23 Encounter for immunization: Secondary | ICD-10-CM | POA: Diagnosis not present

## 2020-06-26 DIAGNOSIS — N390 Urinary tract infection, site not specified: Secondary | ICD-10-CM | POA: Diagnosis not present

## 2020-06-26 DIAGNOSIS — J441 Chronic obstructive pulmonary disease with (acute) exacerbation: Secondary | ICD-10-CM | POA: Diagnosis not present

## 2020-06-26 DIAGNOSIS — Z9981 Dependence on supplemental oxygen: Secondary | ICD-10-CM | POA: Diagnosis not present

## 2020-06-26 DIAGNOSIS — J9621 Acute and chronic respiratory failure with hypoxia: Secondary | ICD-10-CM | POA: Diagnosis not present

## 2020-06-26 DIAGNOSIS — I5033 Acute on chronic diastolic (congestive) heart failure: Secondary | ICD-10-CM | POA: Diagnosis not present

## 2020-06-26 DIAGNOSIS — N179 Acute kidney failure, unspecified: Secondary | ICD-10-CM | POA: Diagnosis not present

## 2020-06-26 DIAGNOSIS — E039 Hypothyroidism, unspecified: Secondary | ICD-10-CM | POA: Diagnosis not present

## 2020-06-26 DIAGNOSIS — J440 Chronic obstructive pulmonary disease with acute lower respiratory infection: Secondary | ICD-10-CM | POA: Diagnosis not present

## 2020-06-26 DIAGNOSIS — I110 Hypertensive heart disease with heart failure: Secondary | ICD-10-CM | POA: Diagnosis not present

## 2020-06-26 DIAGNOSIS — I2510 Atherosclerotic heart disease of native coronary artery without angina pectoris: Secondary | ICD-10-CM | POA: Diagnosis not present

## 2020-06-26 DIAGNOSIS — Z7951 Long term (current) use of inhaled steroids: Secondary | ICD-10-CM | POA: Diagnosis not present

## 2020-06-26 DIAGNOSIS — Z87891 Personal history of nicotine dependence: Secondary | ICD-10-CM | POA: Diagnosis not present

## 2020-06-26 DIAGNOSIS — E1151 Type 2 diabetes mellitus with diabetic peripheral angiopathy without gangrene: Secondary | ICD-10-CM | POA: Diagnosis not present

## 2020-06-26 DIAGNOSIS — L89322 Pressure ulcer of left buttock, stage 2: Secondary | ICD-10-CM | POA: Diagnosis not present

## 2020-06-26 DIAGNOSIS — Z794 Long term (current) use of insulin: Secondary | ICD-10-CM | POA: Diagnosis not present

## 2020-06-26 DIAGNOSIS — I252 Old myocardial infarction: Secondary | ICD-10-CM | POA: Diagnosis not present

## 2020-06-26 DIAGNOSIS — J9622 Acute and chronic respiratory failure with hypercapnia: Secondary | ICD-10-CM | POA: Diagnosis not present

## 2020-06-26 DIAGNOSIS — Z7902 Long term (current) use of antithrombotics/antiplatelets: Secondary | ICD-10-CM | POA: Diagnosis not present

## 2020-06-26 DIAGNOSIS — E1165 Type 2 diabetes mellitus with hyperglycemia: Secondary | ICD-10-CM | POA: Diagnosis not present

## 2020-06-26 DIAGNOSIS — L89312 Pressure ulcer of right buttock, stage 2: Secondary | ICD-10-CM | POA: Diagnosis not present

## 2020-06-26 DIAGNOSIS — E785 Hyperlipidemia, unspecified: Secondary | ICD-10-CM | POA: Diagnosis not present

## 2020-06-26 DIAGNOSIS — F32A Depression, unspecified: Secondary | ICD-10-CM | POA: Diagnosis not present

## 2020-06-29 DIAGNOSIS — J449 Chronic obstructive pulmonary disease, unspecified: Secondary | ICD-10-CM | POA: Diagnosis not present

## 2020-07-01 DIAGNOSIS — N179 Acute kidney failure, unspecified: Secondary | ICD-10-CM | POA: Diagnosis not present

## 2020-07-01 DIAGNOSIS — Z794 Long term (current) use of insulin: Secondary | ICD-10-CM | POA: Diagnosis not present

## 2020-07-01 DIAGNOSIS — L89312 Pressure ulcer of right buttock, stage 2: Secondary | ICD-10-CM | POA: Diagnosis not present

## 2020-07-01 DIAGNOSIS — J440 Chronic obstructive pulmonary disease with acute lower respiratory infection: Secondary | ICD-10-CM | POA: Diagnosis not present

## 2020-07-01 DIAGNOSIS — N390 Urinary tract infection, site not specified: Secondary | ICD-10-CM | POA: Diagnosis not present

## 2020-07-01 DIAGNOSIS — E1151 Type 2 diabetes mellitus with diabetic peripheral angiopathy without gangrene: Secondary | ICD-10-CM | POA: Diagnosis not present

## 2020-07-01 DIAGNOSIS — Z7902 Long term (current) use of antithrombotics/antiplatelets: Secondary | ICD-10-CM | POA: Diagnosis not present

## 2020-07-01 DIAGNOSIS — E039 Hypothyroidism, unspecified: Secondary | ICD-10-CM | POA: Diagnosis not present

## 2020-07-01 DIAGNOSIS — E785 Hyperlipidemia, unspecified: Secondary | ICD-10-CM | POA: Diagnosis not present

## 2020-07-01 DIAGNOSIS — J9621 Acute and chronic respiratory failure with hypoxia: Secondary | ICD-10-CM | POA: Diagnosis not present

## 2020-07-01 DIAGNOSIS — I252 Old myocardial infarction: Secondary | ICD-10-CM | POA: Diagnosis not present

## 2020-07-01 DIAGNOSIS — Z7951 Long term (current) use of inhaled steroids: Secondary | ICD-10-CM | POA: Diagnosis not present

## 2020-07-01 DIAGNOSIS — I110 Hypertensive heart disease with heart failure: Secondary | ICD-10-CM | POA: Diagnosis not present

## 2020-07-01 DIAGNOSIS — Z9981 Dependence on supplemental oxygen: Secondary | ICD-10-CM | POA: Diagnosis not present

## 2020-07-01 DIAGNOSIS — F32A Depression, unspecified: Secondary | ICD-10-CM | POA: Diagnosis not present

## 2020-07-01 DIAGNOSIS — J9622 Acute and chronic respiratory failure with hypercapnia: Secondary | ICD-10-CM | POA: Diagnosis not present

## 2020-07-01 DIAGNOSIS — E1165 Type 2 diabetes mellitus with hyperglycemia: Secondary | ICD-10-CM | POA: Diagnosis not present

## 2020-07-01 DIAGNOSIS — I5033 Acute on chronic diastolic (congestive) heart failure: Secondary | ICD-10-CM | POA: Diagnosis not present

## 2020-07-01 DIAGNOSIS — Z87891 Personal history of nicotine dependence: Secondary | ICD-10-CM | POA: Diagnosis not present

## 2020-07-01 DIAGNOSIS — L89322 Pressure ulcer of left buttock, stage 2: Secondary | ICD-10-CM | POA: Diagnosis not present

## 2020-07-01 DIAGNOSIS — J441 Chronic obstructive pulmonary disease with (acute) exacerbation: Secondary | ICD-10-CM | POA: Diagnosis not present

## 2020-07-01 DIAGNOSIS — I2510 Atherosclerotic heart disease of native coronary artery without angina pectoris: Secondary | ICD-10-CM | POA: Diagnosis not present

## 2020-07-03 DIAGNOSIS — R0902 Hypoxemia: Secondary | ICD-10-CM | POA: Diagnosis not present

## 2020-07-03 DIAGNOSIS — J449 Chronic obstructive pulmonary disease, unspecified: Secondary | ICD-10-CM | POA: Diagnosis not present

## 2020-07-03 DIAGNOSIS — I509 Heart failure, unspecified: Secondary | ICD-10-CM | POA: Diagnosis not present

## 2020-07-09 DIAGNOSIS — J440 Chronic obstructive pulmonary disease with acute lower respiratory infection: Secondary | ICD-10-CM | POA: Diagnosis not present

## 2020-07-09 DIAGNOSIS — J9622 Acute and chronic respiratory failure with hypercapnia: Secondary | ICD-10-CM | POA: Diagnosis not present

## 2020-07-09 DIAGNOSIS — F32A Depression, unspecified: Secondary | ICD-10-CM | POA: Diagnosis not present

## 2020-07-09 DIAGNOSIS — L89322 Pressure ulcer of left buttock, stage 2: Secondary | ICD-10-CM | POA: Diagnosis not present

## 2020-07-09 DIAGNOSIS — Z7951 Long term (current) use of inhaled steroids: Secondary | ICD-10-CM | POA: Diagnosis not present

## 2020-07-09 DIAGNOSIS — Z7902 Long term (current) use of antithrombotics/antiplatelets: Secondary | ICD-10-CM | POA: Diagnosis not present

## 2020-07-09 DIAGNOSIS — E1165 Type 2 diabetes mellitus with hyperglycemia: Secondary | ICD-10-CM | POA: Diagnosis not present

## 2020-07-09 DIAGNOSIS — I5033 Acute on chronic diastolic (congestive) heart failure: Secondary | ICD-10-CM | POA: Diagnosis not present

## 2020-07-09 DIAGNOSIS — N179 Acute kidney failure, unspecified: Secondary | ICD-10-CM | POA: Diagnosis not present

## 2020-07-09 DIAGNOSIS — L89312 Pressure ulcer of right buttock, stage 2: Secondary | ICD-10-CM | POA: Diagnosis not present

## 2020-07-09 DIAGNOSIS — J9621 Acute and chronic respiratory failure with hypoxia: Secondary | ICD-10-CM | POA: Diagnosis not present

## 2020-07-09 DIAGNOSIS — Z87891 Personal history of nicotine dependence: Secondary | ICD-10-CM | POA: Diagnosis not present

## 2020-07-09 DIAGNOSIS — I110 Hypertensive heart disease with heart failure: Secondary | ICD-10-CM | POA: Diagnosis not present

## 2020-07-09 DIAGNOSIS — J441 Chronic obstructive pulmonary disease with (acute) exacerbation: Secondary | ICD-10-CM | POA: Diagnosis not present

## 2020-07-09 DIAGNOSIS — E785 Hyperlipidemia, unspecified: Secondary | ICD-10-CM | POA: Diagnosis not present

## 2020-07-09 DIAGNOSIS — E039 Hypothyroidism, unspecified: Secondary | ICD-10-CM | POA: Diagnosis not present

## 2020-07-09 DIAGNOSIS — Z9981 Dependence on supplemental oxygen: Secondary | ICD-10-CM | POA: Diagnosis not present

## 2020-07-09 DIAGNOSIS — I2510 Atherosclerotic heart disease of native coronary artery without angina pectoris: Secondary | ICD-10-CM | POA: Diagnosis not present

## 2020-07-09 DIAGNOSIS — Z794 Long term (current) use of insulin: Secondary | ICD-10-CM | POA: Diagnosis not present

## 2020-07-09 DIAGNOSIS — N390 Urinary tract infection, site not specified: Secondary | ICD-10-CM | POA: Diagnosis not present

## 2020-07-09 DIAGNOSIS — I252 Old myocardial infarction: Secondary | ICD-10-CM | POA: Diagnosis not present

## 2020-07-09 DIAGNOSIS — E1151 Type 2 diabetes mellitus with diabetic peripheral angiopathy without gangrene: Secondary | ICD-10-CM | POA: Diagnosis not present

## 2020-07-11 DIAGNOSIS — J441 Chronic obstructive pulmonary disease with (acute) exacerbation: Secondary | ICD-10-CM | POA: Diagnosis not present

## 2020-07-11 DIAGNOSIS — F32A Depression, unspecified: Secondary | ICD-10-CM | POA: Diagnosis not present

## 2020-07-11 DIAGNOSIS — L89312 Pressure ulcer of right buttock, stage 2: Secondary | ICD-10-CM | POA: Diagnosis not present

## 2020-07-11 DIAGNOSIS — I252 Old myocardial infarction: Secondary | ICD-10-CM | POA: Diagnosis not present

## 2020-07-11 DIAGNOSIS — Z9981 Dependence on supplemental oxygen: Secondary | ICD-10-CM | POA: Diagnosis not present

## 2020-07-11 DIAGNOSIS — N390 Urinary tract infection, site not specified: Secondary | ICD-10-CM | POA: Diagnosis not present

## 2020-07-11 DIAGNOSIS — I110 Hypertensive heart disease with heart failure: Secondary | ICD-10-CM | POA: Diagnosis not present

## 2020-07-11 DIAGNOSIS — E785 Hyperlipidemia, unspecified: Secondary | ICD-10-CM | POA: Diagnosis not present

## 2020-07-11 DIAGNOSIS — Z7902 Long term (current) use of antithrombotics/antiplatelets: Secondary | ICD-10-CM | POA: Diagnosis not present

## 2020-07-11 DIAGNOSIS — J9622 Acute and chronic respiratory failure with hypercapnia: Secondary | ICD-10-CM | POA: Diagnosis not present

## 2020-07-11 DIAGNOSIS — E1165 Type 2 diabetes mellitus with hyperglycemia: Secondary | ICD-10-CM | POA: Diagnosis not present

## 2020-07-11 DIAGNOSIS — J9621 Acute and chronic respiratory failure with hypoxia: Secondary | ICD-10-CM | POA: Diagnosis not present

## 2020-07-11 DIAGNOSIS — I5033 Acute on chronic diastolic (congestive) heart failure: Secondary | ICD-10-CM | POA: Diagnosis not present

## 2020-07-11 DIAGNOSIS — Z87891 Personal history of nicotine dependence: Secondary | ICD-10-CM | POA: Diagnosis not present

## 2020-07-11 DIAGNOSIS — N179 Acute kidney failure, unspecified: Secondary | ICD-10-CM | POA: Diagnosis not present

## 2020-07-11 DIAGNOSIS — E039 Hypothyroidism, unspecified: Secondary | ICD-10-CM | POA: Diagnosis not present

## 2020-07-11 DIAGNOSIS — Z794 Long term (current) use of insulin: Secondary | ICD-10-CM | POA: Diagnosis not present

## 2020-07-11 DIAGNOSIS — E1151 Type 2 diabetes mellitus with diabetic peripheral angiopathy without gangrene: Secondary | ICD-10-CM | POA: Diagnosis not present

## 2020-07-11 DIAGNOSIS — J440 Chronic obstructive pulmonary disease with acute lower respiratory infection: Secondary | ICD-10-CM | POA: Diagnosis not present

## 2020-07-11 DIAGNOSIS — I2510 Atherosclerotic heart disease of native coronary artery without angina pectoris: Secondary | ICD-10-CM | POA: Diagnosis not present

## 2020-07-11 DIAGNOSIS — L89322 Pressure ulcer of left buttock, stage 2: Secondary | ICD-10-CM | POA: Diagnosis not present

## 2020-07-11 DIAGNOSIS — Z7951 Long term (current) use of inhaled steroids: Secondary | ICD-10-CM | POA: Diagnosis not present

## 2020-07-15 DIAGNOSIS — J9621 Acute and chronic respiratory failure with hypoxia: Secondary | ICD-10-CM | POA: Diagnosis not present

## 2020-07-15 DIAGNOSIS — Z9981 Dependence on supplemental oxygen: Secondary | ICD-10-CM | POA: Diagnosis not present

## 2020-07-15 DIAGNOSIS — E039 Hypothyroidism, unspecified: Secondary | ICD-10-CM | POA: Diagnosis not present

## 2020-07-15 DIAGNOSIS — Z7902 Long term (current) use of antithrombotics/antiplatelets: Secondary | ICD-10-CM | POA: Diagnosis not present

## 2020-07-15 DIAGNOSIS — N179 Acute kidney failure, unspecified: Secondary | ICD-10-CM | POA: Diagnosis not present

## 2020-07-15 DIAGNOSIS — Z794 Long term (current) use of insulin: Secondary | ICD-10-CM | POA: Diagnosis not present

## 2020-07-15 DIAGNOSIS — E1165 Type 2 diabetes mellitus with hyperglycemia: Secondary | ICD-10-CM | POA: Diagnosis not present

## 2020-07-15 DIAGNOSIS — J9622 Acute and chronic respiratory failure with hypercapnia: Secondary | ICD-10-CM | POA: Diagnosis not present

## 2020-07-15 DIAGNOSIS — N390 Urinary tract infection, site not specified: Secondary | ICD-10-CM | POA: Diagnosis not present

## 2020-07-15 DIAGNOSIS — E785 Hyperlipidemia, unspecified: Secondary | ICD-10-CM | POA: Diagnosis not present

## 2020-07-15 DIAGNOSIS — I2510 Atherosclerotic heart disease of native coronary artery without angina pectoris: Secondary | ICD-10-CM | POA: Diagnosis not present

## 2020-07-15 DIAGNOSIS — I252 Old myocardial infarction: Secondary | ICD-10-CM | POA: Diagnosis not present

## 2020-07-15 DIAGNOSIS — I110 Hypertensive heart disease with heart failure: Secondary | ICD-10-CM | POA: Diagnosis not present

## 2020-07-15 DIAGNOSIS — L89322 Pressure ulcer of left buttock, stage 2: Secondary | ICD-10-CM | POA: Diagnosis not present

## 2020-07-15 DIAGNOSIS — I5033 Acute on chronic diastolic (congestive) heart failure: Secondary | ICD-10-CM | POA: Diagnosis not present

## 2020-07-15 DIAGNOSIS — Z87891 Personal history of nicotine dependence: Secondary | ICD-10-CM | POA: Diagnosis not present

## 2020-07-15 DIAGNOSIS — J441 Chronic obstructive pulmonary disease with (acute) exacerbation: Secondary | ICD-10-CM | POA: Diagnosis not present

## 2020-07-15 DIAGNOSIS — J440 Chronic obstructive pulmonary disease with acute lower respiratory infection: Secondary | ICD-10-CM | POA: Diagnosis not present

## 2020-07-15 DIAGNOSIS — Z7951 Long term (current) use of inhaled steroids: Secondary | ICD-10-CM | POA: Diagnosis not present

## 2020-07-15 DIAGNOSIS — E1151 Type 2 diabetes mellitus with diabetic peripheral angiopathy without gangrene: Secondary | ICD-10-CM | POA: Diagnosis not present

## 2020-07-15 DIAGNOSIS — F32A Depression, unspecified: Secondary | ICD-10-CM | POA: Diagnosis not present

## 2020-07-15 DIAGNOSIS — L89312 Pressure ulcer of right buttock, stage 2: Secondary | ICD-10-CM | POA: Diagnosis not present

## 2020-07-16 DIAGNOSIS — J449 Chronic obstructive pulmonary disease, unspecified: Secondary | ICD-10-CM | POA: Diagnosis not present

## 2020-07-16 DIAGNOSIS — I509 Heart failure, unspecified: Secondary | ICD-10-CM | POA: Diagnosis not present

## 2020-07-16 DIAGNOSIS — R0902 Hypoxemia: Secondary | ICD-10-CM | POA: Diagnosis not present

## 2020-07-17 DIAGNOSIS — I2510 Atherosclerotic heart disease of native coronary artery without angina pectoris: Secondary | ICD-10-CM | POA: Insufficient documentation

## 2020-07-17 DIAGNOSIS — I509 Heart failure, unspecified: Secondary | ICD-10-CM | POA: Insufficient documentation

## 2020-07-17 DIAGNOSIS — I219 Acute myocardial infarction, unspecified: Secondary | ICD-10-CM | POA: Insufficient documentation

## 2020-07-17 DIAGNOSIS — E7800 Pure hypercholesterolemia, unspecified: Secondary | ICD-10-CM | POA: Insufficient documentation

## 2020-07-17 DIAGNOSIS — R519 Headache, unspecified: Secondary | ICD-10-CM | POA: Insufficient documentation

## 2020-07-17 DIAGNOSIS — J189 Pneumonia, unspecified organism: Secondary | ICD-10-CM | POA: Insufficient documentation

## 2020-07-17 DIAGNOSIS — Z9981 Dependence on supplemental oxygen: Secondary | ICD-10-CM | POA: Insufficient documentation

## 2020-07-17 DIAGNOSIS — E119 Type 2 diabetes mellitus without complications: Secondary | ICD-10-CM | POA: Insufficient documentation

## 2020-07-18 ENCOUNTER — Ambulatory Visit: Payer: Medicare Other | Admitting: Cardiology

## 2020-07-22 ENCOUNTER — Ambulatory Visit: Payer: Medicare Other | Admitting: Cardiology

## 2020-07-28 ENCOUNTER — Other Ambulatory Visit: Payer: Self-pay

## 2020-07-29 ENCOUNTER — Encounter: Payer: Self-pay | Admitting: Cardiology

## 2020-07-29 ENCOUNTER — Other Ambulatory Visit: Payer: Self-pay

## 2020-07-29 ENCOUNTER — Ambulatory Visit (INDEPENDENT_AMBULATORY_CARE_PROVIDER_SITE_OTHER): Payer: Medicare Other | Admitting: Cardiology

## 2020-07-29 VITALS — BP 163/72 | HR 78 | Ht 62.5 in | Wt 159.6 lb

## 2020-07-29 DIAGNOSIS — I5032 Chronic diastolic (congestive) heart failure: Secondary | ICD-10-CM | POA: Diagnosis not present

## 2020-07-29 DIAGNOSIS — B372 Candidiasis of skin and nail: Secondary | ICD-10-CM | POA: Diagnosis not present

## 2020-07-29 DIAGNOSIS — E088 Diabetes mellitus due to underlying condition with unspecified complications: Secondary | ICD-10-CM | POA: Diagnosis not present

## 2020-07-29 DIAGNOSIS — F17210 Nicotine dependence, cigarettes, uncomplicated: Secondary | ICD-10-CM | POA: Diagnosis not present

## 2020-07-29 DIAGNOSIS — Z794 Long term (current) use of insulin: Secondary | ICD-10-CM | POA: Diagnosis not present

## 2020-07-29 DIAGNOSIS — E1140 Type 2 diabetes mellitus with diabetic neuropathy, unspecified: Secondary | ICD-10-CM | POA: Diagnosis not present

## 2020-07-29 DIAGNOSIS — I2510 Atherosclerotic heart disease of native coronary artery without angina pectoris: Secondary | ICD-10-CM | POA: Diagnosis not present

## 2020-07-29 DIAGNOSIS — Z9981 Dependence on supplemental oxygen: Secondary | ICD-10-CM

## 2020-07-29 DIAGNOSIS — J449 Chronic obstructive pulmonary disease, unspecified: Secondary | ICD-10-CM | POA: Diagnosis not present

## 2020-07-29 DIAGNOSIS — I10 Essential (primary) hypertension: Secondary | ICD-10-CM | POA: Diagnosis not present

## 2020-07-29 NOTE — Progress Notes (Signed)
Cardiology Office Note:    Date:  07/29/2020   ID:  Brooke Hudson, DOB Jul 17, 1951, MRN [id number]  PCP:  Renaldo Reel, PA  Cardiologist:  Jenean Lindau, MD   Referring MD: Renaldo Reel, PA    ASSESSMENT:    1. Coronary artery disease involving native coronary artery of native heart without angina pectoris   2. Essential hypertension   3. On home oxygen therapy   4. Cigarette smoker   5. Diabetes mellitus due to underlying condition with unspecified complications ([place])    PLAN:    In order of problems listed above:  1. Coronary artery disease: Secondary prevention stressed with the patient.  Importance of compliance with diet medication stressed and she vocalized understanding. 2. Congestive heart failure: Preserved ejection fraction: Congestive heart failure issues were discussed and she understands.  Salt intake issues and daily weights were discussed and her granddaughter is very meticulous with this. 3. Diabetes mellitus: This is uncontrolled and hemoglobin A1c is elevated based on the KPN sheet.  I discussed diet and she understands.  She promises to do better. 4. Mixed dyslipidemia: On statin therapy and managed by primary care provider. 5. Cigarette smoker: I spent 5 minutes with the patient discussing solely about smoking. Smoking cessation was counseled. I suggested to the patient also different medications and pharmacological interventions. Patient is keen to try stopping on its own at this time. He will get back to me if he needs any further assistance in this matter. 6. Patient will be seen in follow-up appointment in 6 months or earlier if the patient has any concerns    Medication Adjustments/Labs and Tests Ordered: Current medicines are reviewed at length with the patient today.  Concerns regarding medicines are outlined above.  No orders of the defined types were placed in this encounter.  No orders of the defined types were placed in this  encounter.    No chief complaint on file.    History of Present Illness:    Brooke Hudson is a 69 y.o. female.  Patient has past medical history of coronary artery disease, heart failure with preserved ejection fraction, COPD on oxygen essential hypertension uncontrolled diabetes mellitus, COPD on home oxygen and unfortunately continues to smoke.  She was admitted to the hospital with congestive heart failure and I reviewed those records.  Her ejection fraction was preserved.  Subsequently she is done fine.  She is brought in in a wheelchair by her granddaughter who is a Marine scientist.  She lives with her and keeps track of her health and blood pressures.  She mentions to me that her blood pressures are fine at home in the range of 120/70.  Past Medical History:  Diagnosis Date  . Anemia   . 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 Bunion of great toe 06/28/2018  . CAD (coronary artery disease), native coronary artery 01/07/2016  . Carotid artery stenosis 06/28/2018  . Cerebral infarction ([place]) 06/16/2020   Formatting of this note might be different from the original. 10/1 IMO update  . CHF (congestive heart failure) ([place])   . Cigarette smoker 07/07/2018  . COPD not affecting current episode of care ([place]) 10/01/2019  . Coronary artery disease   . CVD (cardiovascular disease)   . Daily headache    "lately daily; normally it's weekly" (01/07/2016)  . Deposits on intraocular lens 06/28/2018  . Detached retina, right 06/28/2018  . DM type 2 causing eye disease, not at goal [facility]) 01/07/2016  .  Dyslipidemia 06/28/2018  . Edema 06/28/2018  . Encounter for long-term (current) use of other medications 02/22/2012  . Essential hypertension 01/07/2016  . Hypercholesterolemia   . Hypothyroidism 10/01/2019  . 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 Murmur, cardiac 06/28/2018  . Myocardial infarction ([place]) ~ 2005  . 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 On home oxygen therapy    "2L just at night" (01/07/2016)  . Orbital  cellulitis 01/07/2016  . Peripheral edema 10/15/2015  . Pneumonia ~ 2016  . Pre-operative cardiovascular examination 07/07/2018  . Profound vision impairment both eyes 12/01/2011  . PVD (peripheral vascular disease) with claudication ([place]) 04/20/2011  . Shortness of breath dyspnea    "alot" (01/07/2016)  . Smoker 06/28/2018  . Stroke (cerebrum) ([place]) 2010  . Type II diabetes mellitus ([place])   . Vision loss, right eye 06/28/2018    Past Surgical History:  Procedure Laterality Date  . CATARACT EXTRACTION W/ INTRAOCULAR LENS  IMPLANT, BILATERAL Bilateral   . CESAREAN SECTION  1980  . CORONARY ANGIOPLASTY WITH STENT PLACEMENT  ~ 2005 X 2   "put 5 stents in w/in 1 wk; [address]  . EYE SURGERY    . GLAUCOMA SURGERY Bilateral   . LEG SURGERY     5 stents in right leg  . TUBAL LIGATION      Current Medications: Current Meds  Medication Sig  . albuterol (PROVENTIL HFA;VENTOLIN HFA) 108 (90 Base) MCG/ACT inhaler Inhale 1 puff into the lungs every 6 (six) hours as needed for wheezing or shortness of breath.  Marland Kitchen alendronate (FOSAMAX) 70 MG tablet Take 70 mg by mouth once a week. Take with a full glass of water on an empty stomach.  Marland Kitchen amLODipine (NORVASC) 5 MG tablet Take 5 mg by mouth daily.   Marland Kitchen atenolol (TENORMIN) 50 MG tablet Take 50 mg by mouth daily.  . Blood Glucose Monitoring Suppl (PRODIGY POCKET BLOOD GLUCOSE) w/Device KIT   . escitalopram (LEXAPRO) 10 MG tablet Take 10 mg by mouth daily.  . fluticasone (FLONASE) 50 MCG/ACT nasal spray Place 1 spray into both nostrils in the morning and at bedtime.  . fluticasone furoate-vilanterol (BREO ELLIPTA) 100-25 MCG/INH AEPB Inhale 1 puff into the lungs daily.  . furosemide (LASIX) 40 MG tablet Take 40 mg by mouth daily.   . hydrOXYzine (ATARAX/VISTARIL) 25 MG tablet Take 25 mg by mouth daily.  . insulin glargine (LANTUS) 100 UNIT/ML injection Inject 55 Units into the skin daily.   Marland Kitchen levothyroxine (SYNTHROID, LEVOTHROID) 25 MCG tablet Take 1  tablet by mouth daily.  Marland Kitchen loperamide (IMODIUM A-D) 2 MG tablet Take 2 mg by mouth 2 (two) times daily as needed for diarrhea or loose stools.  . montelukast (SINGULAIR) 10 MG tablet Take 10 mg by mouth at bedtime.  . OXYGEN Inhale 2 L into the lungs continuous.  . pantoprazole (PROTONIX) 40 MG tablet Take 40 mg by mouth daily.   . rosuvastatin (CRESTOR) 20 MG tablet Take 20 mg by mouth daily.   . sitaGLIPtin-metformin (JANUMET) 50-1000 MG tablet Take 1 tablet by mouth daily.  . tamsulosin (FLOMAX) 0.4 MG CAPS capsule Take 0.4 mg by mouth daily.  . Wound Dressings (ALLEVYN PLUS SACRUM EX) Apply topically in the morning and at bedtime.     Allergies:   Bactrim [sulfamethoxazole-trimethoprim] and Benzocaine   Social History   Socioeconomic History  . Marital status: Divorced    Spouse name: Not on file  . Number of children: 3  . Years of education:  12  . Highest education level: Not on file  Occupational History  . Not on file  Tobacco Use  . Smoking status: Current Every Day Smoker    Packs/day: 0.50    Years: 36.00    Pack years: 18.00    Types: Cigarettes  . Smokeless tobacco: Never Used  Vaping Use  . Vaping Use: Never used  Substance and Sexual Activity  . Alcohol use: No  . Drug use: No  . Sexual activity: Never  Other Topics Concern  . Not on file  Social History Narrative   Lives with daughter    Caffeine use: Coffee daily, soda daily    Left handed    Social Determinants of Health   Financial Resource Strain:   . Difficulty of Paying Living Expenses: Not on file  Food Insecurity:   . Worried About Charity fundraiser in the Last Year: Not on file  . Ran Out of Food in the Last Year: Not on file  Transportation Needs:   . Lack of Transportation (Medical): Not on file  . Lack of Transportation (Non-Medical): Not on file  Physical Activity:   . Days of Exercise per Week: Not on file  . Minutes of Exercise per Session: Not on file  Stress:   . Feeling of  Stress : Not on file  Social Connections:   . Frequency of Communication with Friends and Family: Not on file  . Frequency of Social Gatherings with Friends and Family: Not on file  . Attends Religious Services: Not on file  . Active Member of Clubs or Organizations: Not on file  . Attends Archivist Meetings: Not on file  . Marital Status: Not on file     Family History: The patient's family history includes Alcohol abuse in her father; Arthritis in her mother; Asthma in her sister; COPD in her sister; Cancer - Lung in her father; Diabetes in her mother, sister, sister, and another family member; Heart disease in her sister; Hypertension in her mother and sister; Renal Disease in her sister; Stroke in her sister.  ROS:   Please see the history of present illness.    All other systems reviewed and are negative.  EKGs/Labs/Other Studies Reviewed:    The following studies were reviewed today: I discussed my findings with the patient in extensive length   Recent Labs: No results found for requested labs within last 8760 hours.  Recent Lipid Panel    Component Value Date/Time   CHOL 139 08/08/2018 1032   TRIG 120 08/08/2018 1032   HDL 44 08/08/2018 1032   CHOLHDL 3.2 08/08/2018 1032   LDLCALC 71 08/08/2018 1032    Physical Exam:    VS:  BP (!) 163/72   Pulse 78   Ht 5' 2.5" (1.588 m)   Wt 159 lb 9.6 oz (72.4 kg)   SpO2 96%   BMI 28.73 kg/m     Wt Readings from Last 3 Encounters:  07/29/20 159 lb 9.6 oz (72.4 kg)  06/05/20 156 lb (70.8 kg)  10/26/19 161 lb (73 kg)     GEN: Patient is in no acute distress HEENT: Normal NECK: No JVD; No carotid bruits LYMPHATICS: No lymphadenopathy CARDIAC: Hear sounds regular, 2/6 systolic murmur at the apex. RESPIRATORY:  Clear to auscultation without rales, wheezing or rhonchi  ABDOMEN: Soft, non-tender, non-distended MUSCULOSKELETAL:  No edema; No deformity  SKIN: Warm and dry NEUROLOGIC:  Alert and oriented x  3 PSYCHIATRIC:  Normal affect  Signed, Jenean Lindau, MD  07/29/2020 2:53 PM    [place]

## 2020-07-29 NOTE — Patient Instructions (Signed)

## 2020-07-31 DIAGNOSIS — J301 Allergic rhinitis due to pollen: Secondary | ICD-10-CM | POA: Diagnosis not present

## 2020-07-31 DIAGNOSIS — R5383 Other fatigue: Secondary | ICD-10-CM | POA: Diagnosis not present

## 2020-07-31 DIAGNOSIS — Z7189 Other specified counseling: Secondary | ICD-10-CM | POA: Diagnosis not present

## 2020-07-31 DIAGNOSIS — F17210 Nicotine dependence, cigarettes, uncomplicated: Secondary | ICD-10-CM | POA: Diagnosis not present

## 2020-07-31 DIAGNOSIS — J4530 Mild persistent asthma, uncomplicated: Secondary | ICD-10-CM | POA: Diagnosis not present

## 2020-08-02 DIAGNOSIS — R0902 Hypoxemia: Secondary | ICD-10-CM | POA: Diagnosis not present

## 2020-08-02 DIAGNOSIS — J449 Chronic obstructive pulmonary disease, unspecified: Secondary | ICD-10-CM | POA: Diagnosis not present

## 2020-08-02 DIAGNOSIS — I509 Heart failure, unspecified: Secondary | ICD-10-CM | POA: Diagnosis not present

## 2020-08-06 ENCOUNTER — Encounter: Payer: Self-pay | Admitting: Sports Medicine

## 2020-08-06 ENCOUNTER — Other Ambulatory Visit: Payer: Self-pay | Admitting: *Deleted

## 2020-08-06 ENCOUNTER — Ambulatory Visit (INDEPENDENT_AMBULATORY_CARE_PROVIDER_SITE_OTHER): Payer: Medicare Other

## 2020-08-06 ENCOUNTER — Other Ambulatory Visit: Payer: Self-pay

## 2020-08-06 ENCOUNTER — Ambulatory Visit (INDEPENDENT_AMBULATORY_CARE_PROVIDER_SITE_OTHER): Payer: Medicare Other | Admitting: Sports Medicine

## 2020-08-06 DIAGNOSIS — L03031 Cellulitis of right toe: Secondary | ICD-10-CM

## 2020-08-06 DIAGNOSIS — I739 Peripheral vascular disease, unspecified: Secondary | ICD-10-CM | POA: Diagnosis not present

## 2020-08-06 DIAGNOSIS — L97511 Non-pressure chronic ulcer of other part of right foot limited to breakdown of skin: Secondary | ICD-10-CM

## 2020-08-06 DIAGNOSIS — H548 Legal blindness, as defined in USA: Secondary | ICD-10-CM

## 2020-08-06 DIAGNOSIS — E1140 Type 2 diabetes mellitus with diabetic neuropathy, unspecified: Secondary | ICD-10-CM | POA: Diagnosis not present

## 2020-08-06 DIAGNOSIS — L02611 Cutaneous abscess of right foot: Secondary | ICD-10-CM

## 2020-08-06 DIAGNOSIS — E1142 Type 2 diabetes mellitus with diabetic polyneuropathy: Secondary | ICD-10-CM

## 2020-08-06 DIAGNOSIS — L853 Xerosis cutis: Secondary | ICD-10-CM

## 2020-08-06 MED ORDER — GABAPENTIN 300 MG PO CAPS: 300.0000 mg | ORAL_CAPSULE | Freq: Every day | ORAL | 3 refills | Status: AC

## 2020-08-06 NOTE — Progress Notes (Signed)
Subjective: Brooke Hudson is a 69 y.o. female patient seen in office for evaluation of ulceration of the right first toe and multiple abrasions to toes referred by PCP for follow-up evaluation.  Patient is assisted by granddaughter who reports that she has had this wound for over a year and the wound is still the same has had a lot of health issues and has not been able to follow-up with care.  Patient is diabetic with last blood sugar not recorded.  Patient is also blind and has a hard time caring for herself and reports that because of her blindness and her need for oxygen she tends to bump her toes in the wheelchair or runs into things because have a difficult time with seeing.  Patient does admit occasional sharp shooting pains to both feet worse at night.  Denies nausea/fever/vomiting/chills/night sweats/shortness of breath/pain. Patient has no other pedal complaints at this time.    Patient Active Problem List   Diagnosis Date Noted  . Diabetes mellitus due to underlying condition with unspecified complications ([place]) 96/11/5407  . CHF (congestive heart failure) ([place])   . Coronary artery disease   . CVD (cardiovascular disease)   . Daily headache   . Hypercholesterolemia   . Myocardial infarction ([place])   . On home oxygen therapy   . Pneumonia   . Type II diabetes mellitus ([place])   . Cerebral infarction ([place]) 06/16/2020  . COPD not affecting current episode of care ([place]) 10/01/2019  . Hypothyroidism 10/01/2019  . Pre-operative cardiovascular examination 07/07/2018  . Cigarette smoker 07/07/2018  . Anemia 06/28/2018  . 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 Carotid artery stenosis 06/28/2018  . Deposits on intraocular lens 06/28/2018  . Detached retina, right 06/28/2018  . Dyslipidemia 06/28/2018  . Edema 06/28/2018  . Bunion of great toe 06/28/2018  . Murmur, cardiac 06/28/2018  . 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 Smoker 06/28/2018  . Vision loss, right eye 06/28/2018  . Left pontine  stroke ([place]) 11/14/2017  . Orbital cellulitis 01/07/2016  . Essential hypertension 01/07/2016  . DM type 2 causing eye disease, not at goal [facility]) 01/07/2016  . CAD (coronary artery disease), native coronary artery 01/07/2016  . Peripheral edema 10/15/2015  . Encounter for long-term (current) use of other medications 02/22/2012  . Profound vision impairment both eyes 12/01/2011  . PVD (peripheral vascular disease) with claudication ([place]) 04/20/2011  . Stroke (cerebrum) ([place]) 2010   Current Outpatient Medications on File Prior to Visit  Medication Sig Dispense Refill  . albuterol (PROVENTIL HFA;VENTOLIN HFA) 108 (90 Base) MCG/ACT inhaler Inhale 1 puff into the lungs every 6 (six) hours as needed for wheezing or shortness of breath.    Marland Kitchen alendronate (FOSAMAX) 70 MG tablet Take 70 mg by mouth once a week. Take with a full glass of water on an empty stomach.    Marland Kitchen amLODipine (NORVASC) 5 MG tablet Take 5 mg by mouth daily.     Marland Kitchen atenolol (TENORMIN) 50 MG tablet Take 50 mg by mouth daily.    . Blood Glucose Monitoring Suppl (PRODIGY POCKET BLOOD GLUCOSE) w/Device KIT     . escitalopram (LEXAPRO) 10 MG tablet Take 10 mg by mouth daily.    . fluticasone (FLONASE) 50 MCG/ACT nasal spray Place 1 spray into both nostrils in the morning and at bedtime.    . fluticasone furoate-vilanterol (BREO ELLIPTA) 100-25 MCG/INH AEPB Inhale 1 puff into the lungs daily.    . furosemide (LASIX) 40 MG tablet Take 40 mg  by mouth daily.     . hydrOXYzine (ATARAX/VISTARIL) 25 MG tablet Take 25 mg by mouth daily.    . insulin glargine (LANTUS) 100 UNIT/ML injection Inject 55 Units into the skin daily.     Marland Kitchen levothyroxine (SYNTHROID, LEVOTHROID) 25 MCG tablet Take 1 tablet by mouth daily.    Marland Kitchen loperamide (IMODIUM A-D) 2 MG tablet Take 2 mg by mouth 2 (two) times daily as needed for diarrhea or loose stools.    . montelukast (SINGULAIR) 10 MG tablet Take 10 mg by mouth at bedtime.    . OXYGEN Inhale 2 L into the lungs  continuous.    . pantoprazole (PROTONIX) 40 MG tablet Take 40 mg by mouth daily.     . rosuvastatin (CRESTOR) 20 MG tablet Take 20 mg by mouth daily.     . sitaGLIPtin-metformin (JANUMET) 50-1000 MG tablet Take 1 tablet by mouth daily.    . tamsulosin (FLOMAX) 0.4 MG CAPS capsule Take 0.4 mg by mouth daily.    . Wound Dressings (ALLEVYN PLUS SACRUM EX) Apply topically in the morning and at bedtime.     No current facility-administered medications on file prior to visit.   Allergies  Allergen Reactions  . Bactrim [Sulfamethoxazole-Trimethoprim] Other (See Comments)    Dehydrates and disorients/ delusions  . Benzocaine Rash    No results found for this or any previous visit (from the past 2160 hour(s)).  Objective: There were no vitals filed for this visit.  General: Patient is awake, alert, oriented x 3 and in no acute distress.  Dermatology: Skin is warm and dry bilateral with a full thickness ulceration at the right great toe plantar medial aspect.  Ulceration measures 0.5 cm x 0.5 cm x 0.3 cm. There is a Keratotic border with a 20:80 fibro-granular base. The ulceration does not probe to bone. There is no malodor, no active drainage, no erythema, no edema.  There are multiple abrasions to all toes bilateral with dry scabs.  No acute signs of infection.  Minimal dry skin to heels.   Vascular: Dorsalis Pedis pulse = 1/4 Bilateral,  Posterior Tibial pulse = 0/4 Bilateral, 1+ pitting edema o bilateral, capillary Fill Time < 5 seconds  Neurologic: Protective sensation severely diminished using Weinstein monofilament.  Vibratory absent bilateral.  Subjective sharp shooting pains worse at night likely consistent with neuropathy.  Musculosketal: There is bunion and hammertoe deformity.  There is mild pain to palpation to ulcerated area at right great toe.    X-rays reveal hammertoe deformity no osseous destruction at right great toe at area of ulceration noted there is soft tissue emphysema  localized at the area of the wound likely wound defect and not anything concerning for acute infection since clinically the wound appears chronic and stable.  No results for input(s): GRAMSTAIN, LABORGA in the last 8760 hours.  Assessment and Plan:  Problem List Items Addressed This Visit      Endocrine   Type II diabetes mellitus ([place])   Relevant Orders   VAS [place] ABI WITH/WO TBI    Other Visit Diagnoses    Cellulitis and abscess of toe of right foot    -  Primary   Relevant Orders   VAS [place] ABI WITH/WO TBI   WOUND CULTURE   Toe ulcer, right, limited to breakdown of skin (H[facility])       Relevant Orders   VAS [place] ABI WITH/WO TBI   DG Foot Complete Right   WOUND CULTURE   PAD (peripheral  artery disease) ([place])       Relevant Orders   VAS [place] ABI WITH/WO TBI   Diabetic polyneuropathy associated with type 2 diabetes mellitus ([facility]CC)       Relevant Medications   gabapentin (NEURONTIN) 300 MG capsule   Legal blindness       Dry skin            -Examined patient and discussed the progression of the wound and treatment alternatives. -X-rays were performed and reviewed with patient and granddaughter -Excisionally dedbrided ulceration at right great toe to healthy bleeding borders removing nonviable tissue using a sterile chisel blade. Wound measures post debridement as above.  Wound was debrided to the level of the dermis with viable wound base exposed to promote healing. Hemostasis was achieved with manuel pressure. Patient tolerated procedure well without any discomfort or anesthesia necessary for this wound debridement.  -Wound culture obtained will call patient if there is a need for oral antibiotics -Applied Iodosorb and dry Band-Aid dressing and instructed patient to continue with daily dressings at home consisting of the same with help from granddaughter. -Continue with a shoe that does not rub or irritate the toe -Ordered ABIs/vascular studies for further evaluation due to chronic nature  of ulcer -Prescribed gabapentin 300 mg at bedtime for neuropathy pain -Dispensed a sample of foot miracle cream to use at dry skin as needed to heels - Advised patient to go to the ER or return to office if the wound worsens or if constitutional symptoms are present. -Patient to return to office after vascular studies or sooner if issues arise.  Landis Martins, DPM

## 2020-08-15 ENCOUNTER — Other Ambulatory Visit: Payer: Self-pay | Admitting: Sports Medicine

## 2020-08-15 DIAGNOSIS — R0902 Hypoxemia: Secondary | ICD-10-CM | POA: Diagnosis not present

## 2020-08-15 DIAGNOSIS — J449 Chronic obstructive pulmonary disease, unspecified: Secondary | ICD-10-CM | POA: Diagnosis not present

## 2020-08-15 DIAGNOSIS — I509 Heart failure, unspecified: Secondary | ICD-10-CM | POA: Diagnosis not present

## 2020-08-15 LAB — WOUND CULTURE

## 2020-08-15 MED ORDER — LEVOFLOXACIN 500 MG PO TABS: 500.0000 mg | ORAL_TABLET | Freq: Every day | ORAL | 0 refills | Status: DC

## 2020-08-15 NOTE — Progress Notes (Signed)
Sent Levaquin for + wound culture

## 2020-08-18 DIAGNOSIS — E860 Dehydration: Secondary | ICD-10-CM | POA: Diagnosis not present

## 2020-08-18 DIAGNOSIS — N390 Urinary tract infection, site not specified: Secondary | ICD-10-CM | POA: Diagnosis not present

## 2020-08-18 DIAGNOSIS — R42 Dizziness and giddiness: Secondary | ICD-10-CM | POA: Diagnosis not present

## 2020-08-18 DIAGNOSIS — S62502A Fracture of unspecified phalanx of left thumb, initial encounter for closed fracture: Secondary | ICD-10-CM | POA: Diagnosis not present

## 2020-08-18 DIAGNOSIS — M7989 Other specified soft tissue disorders: Secondary | ICD-10-CM | POA: Diagnosis not present

## 2020-08-18 DIAGNOSIS — M25532 Pain in left wrist: Secondary | ICD-10-CM | POA: Diagnosis not present

## 2020-08-18 DIAGNOSIS — S62512A Displaced fracture of proximal phalanx of left thumb, initial encounter for closed fracture: Secondary | ICD-10-CM | POA: Diagnosis not present

## 2020-08-18 DIAGNOSIS — S62232A Other displaced fracture of base of first metacarpal bone, left hand, initial encounter for closed fracture: Secondary | ICD-10-CM | POA: Diagnosis not present

## 2020-08-18 DIAGNOSIS — S62515A Nondisplaced fracture of proximal phalanx of left thumb, initial encounter for closed fracture: Secondary | ICD-10-CM | POA: Diagnosis not present

## 2020-08-19 ENCOUNTER — Telehealth: Payer: Self-pay | Admitting: *Deleted

## 2020-08-19 NOTE — Telephone Encounter (Signed)
Called and spoke with the patient on Friday December 17/2021 and relayed the message per Dr Cannon Kettle. Brooke Hudson

## 2020-08-19 NOTE — Telephone Encounter (Signed)
-----   Message from Landis Martins, [place] sent at 08/15/2020  3:49 PM EST ----- Let patient know that I sent Levaquin for him to take for 1 weeks for + wound culture of psuedomonas -Dr. Chauncey Cruel

## 2020-08-21 DIAGNOSIS — R069 Unspecified abnormalities of breathing: Secondary | ICD-10-CM | POA: Diagnosis not present

## 2020-08-21 DIAGNOSIS — E1122 Type 2 diabetes mellitus with diabetic chronic kidney disease: Secondary | ICD-10-CM | POA: Diagnosis not present

## 2020-08-21 DIAGNOSIS — R0902 Hypoxemia: Secondary | ICD-10-CM | POA: Diagnosis not present

## 2020-08-21 DIAGNOSIS — I252 Old myocardial infarction: Secondary | ICD-10-CM | POA: Diagnosis not present

## 2020-08-21 DIAGNOSIS — Z9981 Dependence on supplemental oxygen: Secondary | ICD-10-CM | POA: Diagnosis not present

## 2020-08-21 DIAGNOSIS — I517 Cardiomegaly: Secondary | ICD-10-CM | POA: Diagnosis not present

## 2020-08-21 DIAGNOSIS — J9621 Acute and chronic respiratory failure with hypoxia: Secondary | ICD-10-CM | POA: Diagnosis not present

## 2020-08-21 DIAGNOSIS — F17210 Nicotine dependence, cigarettes, uncomplicated: Secondary | ICD-10-CM | POA: Diagnosis not present

## 2020-08-21 DIAGNOSIS — N184 Chronic kidney disease, stage 4 (severe): Secondary | ICD-10-CM | POA: Diagnosis not present

## 2020-08-21 DIAGNOSIS — G894 Chronic pain syndrome: Secondary | ICD-10-CM | POA: Diagnosis not present

## 2020-08-21 DIAGNOSIS — R0602 Shortness of breath: Secondary | ICD-10-CM | POA: Diagnosis not present

## 2020-08-21 DIAGNOSIS — I2510 Atherosclerotic heart disease of native coronary artery without angina pectoris: Secondary | ICD-10-CM | POA: Diagnosis not present

## 2020-08-21 DIAGNOSIS — I5033 Acute on chronic diastolic (congestive) heart failure: Secondary | ICD-10-CM | POA: Diagnosis not present

## 2020-08-21 DIAGNOSIS — M1990 Unspecified osteoarthritis, unspecified site: Secondary | ICD-10-CM | POA: Diagnosis not present

## 2020-08-21 DIAGNOSIS — L03116 Cellulitis of left lower limb: Secondary | ICD-10-CM | POA: Diagnosis not present

## 2020-08-21 DIAGNOSIS — J9622 Acute and chronic respiratory failure with hypercapnia: Secondary | ICD-10-CM | POA: Diagnosis not present

## 2020-08-21 DIAGNOSIS — Z7984 Long term (current) use of oral hypoglycemic drugs: Secondary | ICD-10-CM | POA: Diagnosis not present

## 2020-08-21 DIAGNOSIS — J80 Acute respiratory distress syndrome: Secondary | ICD-10-CM | POA: Diagnosis not present

## 2020-08-21 DIAGNOSIS — Z8673 Personal history of transient ischemic attack (TIA), and cerebral infarction without residual deficits: Secondary | ICD-10-CM | POA: Diagnosis not present

## 2020-08-21 DIAGNOSIS — I130 Hypertensive heart and chronic kidney disease with heart failure and stage 1 through stage 4 chronic kidney disease, or unspecified chronic kidney disease: Secondary | ICD-10-CM | POA: Diagnosis not present

## 2020-08-21 DIAGNOSIS — N179 Acute kidney failure, unspecified: Secondary | ICD-10-CM | POA: Diagnosis not present

## 2020-08-21 DIAGNOSIS — J9601 Acute respiratory failure with hypoxia: Secondary | ICD-10-CM | POA: Diagnosis not present

## 2020-08-21 DIAGNOSIS — Z743 Need for continuous supervision: Secondary | ICD-10-CM | POA: Diagnosis not present

## 2020-08-21 DIAGNOSIS — J441 Chronic obstructive pulmonary disease with (acute) exacerbation: Secondary | ICD-10-CM | POA: Diagnosis not present

## 2020-08-21 DIAGNOSIS — I110 Hypertensive heart disease with heart failure: Secondary | ICD-10-CM | POA: Diagnosis not present

## 2020-08-21 DIAGNOSIS — E039 Hypothyroidism, unspecified: Secondary | ICD-10-CM | POA: Diagnosis not present

## 2020-08-21 DIAGNOSIS — Z79899 Other long term (current) drug therapy: Secondary | ICD-10-CM | POA: Diagnosis not present

## 2020-08-21 DIAGNOSIS — I509 Heart failure, unspecified: Secondary | ICD-10-CM | POA: Diagnosis not present

## 2020-08-21 DIAGNOSIS — J90 Pleural effusion, not elsewhere classified: Secondary | ICD-10-CM | POA: Diagnosis not present

## 2020-08-27 DIAGNOSIS — S62512A Displaced fracture of proximal phalanx of left thumb, initial encounter for closed fracture: Secondary | ICD-10-CM | POA: Diagnosis not present

## 2020-08-28 DIAGNOSIS — F17210 Nicotine dependence, cigarettes, uncomplicated: Secondary | ICD-10-CM | POA: Diagnosis not present

## 2020-08-28 DIAGNOSIS — I509 Heart failure, unspecified: Secondary | ICD-10-CM | POA: Diagnosis not present

## 2020-08-28 DIAGNOSIS — J449 Chronic obstructive pulmonary disease, unspecified: Secondary | ICD-10-CM | POA: Diagnosis not present

## 2020-08-28 DIAGNOSIS — R4020 Unspecified coma: Secondary | ICD-10-CM | POA: Diagnosis not present

## 2020-08-28 DIAGNOSIS — E11649 Type 2 diabetes mellitus with hypoglycemia without coma: Secondary | ICD-10-CM | POA: Diagnosis not present

## 2020-08-28 DIAGNOSIS — I252 Old myocardial infarction: Secondary | ICD-10-CM | POA: Diagnosis not present

## 2020-08-28 DIAGNOSIS — R404 Transient alteration of awareness: Secondary | ICD-10-CM | POA: Diagnosis not present

## 2020-08-28 DIAGNOSIS — E162 Hypoglycemia, unspecified: Secondary | ICD-10-CM | POA: Diagnosis not present

## 2020-08-28 DIAGNOSIS — I110 Hypertensive heart disease with heart failure: Secondary | ICD-10-CM | POA: Diagnosis not present

## 2020-08-28 DIAGNOSIS — E161 Other hypoglycemia: Secondary | ICD-10-CM | POA: Diagnosis not present

## 2020-08-28 DIAGNOSIS — Z743 Need for continuous supervision: Secondary | ICD-10-CM | POA: Diagnosis not present

## 2020-08-28 DIAGNOSIS — I2510 Atherosclerotic heart disease of native coronary artery without angina pectoris: Secondary | ICD-10-CM | POA: Diagnosis not present

## 2020-08-29 DIAGNOSIS — J449 Chronic obstructive pulmonary disease, unspecified: Secondary | ICD-10-CM | POA: Diagnosis not present

## 2020-09-02 ENCOUNTER — Telehealth: Payer: Self-pay | Admitting: *Deleted

## 2020-09-02 DIAGNOSIS — R0902 Hypoxemia: Secondary | ICD-10-CM | POA: Diagnosis not present

## 2020-09-02 DIAGNOSIS — I509 Heart failure, unspecified: Secondary | ICD-10-CM | POA: Diagnosis not present

## 2020-09-02 DIAGNOSIS — J449 Chronic obstructive pulmonary disease, unspecified: Secondary | ICD-10-CM | POA: Diagnosis not present

## 2020-09-02 NOTE — Telephone Encounter (Signed)
Kelli from [facility] called and stated that she had gone out to the patient's home for a wound check and stated that the house was an unsafe environment and patient was not wearing shoes and patient's diabetes was uncontrolled and has CHF and Kelli did find a blood sugar glucometer and the blood sugar strip that was 507 and that the strip was expired some time ago and is on oxygen and patient did break the left thumb and had to go to the hospital and the granddaughter stops by but does have to work and get some sleep and can not watch the patient all the time and was calling just to let Dr Cannon Kettle know that [facility] could not admit the patient cause the patient did not want to leave her home and I stated that I would speak with Dr Cannon Kettle. Lattie Haw

## 2020-09-03 DIAGNOSIS — Z9981 Dependence on supplemental oxygen: Secondary | ICD-10-CM | POA: Diagnosis not present

## 2020-09-03 DIAGNOSIS — E11319 Type 2 diabetes mellitus with unspecified diabetic retinopathy without macular edema: Secondary | ICD-10-CM | POA: Diagnosis not present

## 2020-09-03 DIAGNOSIS — F17210 Nicotine dependence, cigarettes, uncomplicated: Secondary | ICD-10-CM | POA: Diagnosis not present

## 2020-09-03 DIAGNOSIS — I2510 Atherosclerotic heart disease of native coronary artery without angina pectoris: Secondary | ICD-10-CM | POA: Diagnosis not present

## 2020-09-03 DIAGNOSIS — E785 Hyperlipidemia, unspecified: Secondary | ICD-10-CM | POA: Diagnosis not present

## 2020-09-03 DIAGNOSIS — G894 Chronic pain syndrome: Secondary | ICD-10-CM | POA: Diagnosis not present

## 2020-09-03 DIAGNOSIS — Z8673 Personal history of transient ischemic attack (TIA), and cerebral infarction without residual deficits: Secondary | ICD-10-CM | POA: Diagnosis not present

## 2020-09-03 DIAGNOSIS — E11621 Type 2 diabetes mellitus with foot ulcer: Secondary | ICD-10-CM | POA: Diagnosis not present

## 2020-09-03 DIAGNOSIS — L97519 Non-pressure chronic ulcer of other part of right foot with unspecified severity: Secondary | ICD-10-CM | POA: Diagnosis not present

## 2020-09-03 DIAGNOSIS — S91101A Unspecified open wound of right great toe without damage to nail, initial encounter: Secondary | ICD-10-CM | POA: Diagnosis not present

## 2020-09-03 DIAGNOSIS — I130 Hypertensive heart and chronic kidney disease with heart failure and stage 1 through stage 4 chronic kidney disease, or unspecified chronic kidney disease: Secondary | ICD-10-CM | POA: Diagnosis not present

## 2020-09-03 DIAGNOSIS — K219 Gastro-esophageal reflux disease without esophagitis: Secondary | ICD-10-CM | POA: Diagnosis not present

## 2020-09-03 DIAGNOSIS — L03115 Cellulitis of right lower limb: Secondary | ICD-10-CM | POA: Diagnosis not present

## 2020-09-03 DIAGNOSIS — Z7983 Long term (current) use of bisphosphonates: Secondary | ICD-10-CM | POA: Diagnosis not present

## 2020-09-03 DIAGNOSIS — E1122 Type 2 diabetes mellitus with diabetic chronic kidney disease: Secondary | ICD-10-CM | POA: Diagnosis not present

## 2020-09-03 DIAGNOSIS — E039 Hypothyroidism, unspecified: Secondary | ICD-10-CM | POA: Diagnosis not present

## 2020-09-03 DIAGNOSIS — M1990 Unspecified osteoarthritis, unspecified site: Secondary | ICD-10-CM | POA: Diagnosis not present

## 2020-09-03 DIAGNOSIS — E1140 Type 2 diabetes mellitus with diabetic neuropathy, unspecified: Secondary | ICD-10-CM | POA: Diagnosis not present

## 2020-09-03 DIAGNOSIS — J449 Chronic obstructive pulmonary disease, unspecified: Secondary | ICD-10-CM | POA: Diagnosis not present

## 2020-09-03 DIAGNOSIS — N184 Chronic kidney disease, stage 4 (severe): Secondary | ICD-10-CM | POA: Diagnosis not present

## 2020-09-03 DIAGNOSIS — E1165 Type 2 diabetes mellitus with hyperglycemia: Secondary | ICD-10-CM | POA: Diagnosis not present

## 2020-09-03 DIAGNOSIS — Z79899 Other long term (current) drug therapy: Secondary | ICD-10-CM | POA: Diagnosis not present

## 2020-09-03 DIAGNOSIS — Z794 Long term (current) use of insulin: Secondary | ICD-10-CM | POA: Diagnosis not present

## 2020-09-03 DIAGNOSIS — I5032 Chronic diastolic (congestive) heart failure: Secondary | ICD-10-CM | POA: Diagnosis not present

## 2020-09-03 DIAGNOSIS — Z20822 Contact with and (suspected) exposure to covid-19: Secondary | ICD-10-CM | POA: Diagnosis not present

## 2020-09-11 ENCOUNTER — Telehealth: Payer: Self-pay | Admitting: Sports Medicine

## 2020-09-11 NOTE — Telephone Encounter (Signed)
pcp called wanting to know if we can call something in for pts foot pain-has f/u visit 09-17-20. She was seen in hospital 09-03-20 for cellulitis.  [facility]

## 2020-09-12 ENCOUNTER — Other Ambulatory Visit: Payer: Self-pay | Admitting: Sports Medicine

## 2020-09-12 DIAGNOSIS — G894 Chronic pain syndrome: Secondary | ICD-10-CM | POA: Diagnosis not present

## 2020-09-12 DIAGNOSIS — R0781 Pleurodynia: Secondary | ICD-10-CM | POA: Diagnosis not present

## 2020-09-12 DIAGNOSIS — M1711 Unilateral primary osteoarthritis, right knee: Secondary | ICD-10-CM | POA: Diagnosis not present

## 2020-09-12 DIAGNOSIS — F17210 Nicotine dependence, cigarettes, uncomplicated: Secondary | ICD-10-CM | POA: Diagnosis not present

## 2020-09-12 DIAGNOSIS — E1343 Other specified diabetes mellitus with diabetic autonomic (poly)neuropathy: Secondary | ICD-10-CM | POA: Diagnosis not present

## 2020-09-12 DIAGNOSIS — L97519 Non-pressure chronic ulcer of other part of right foot with unspecified severity: Secondary | ICD-10-CM | POA: Diagnosis not present

## 2020-09-12 DIAGNOSIS — E1143 Type 2 diabetes mellitus with diabetic autonomic (poly)neuropathy: Secondary | ICD-10-CM | POA: Diagnosis not present

## 2020-09-12 DIAGNOSIS — M25551 Pain in right hip: Secondary | ICD-10-CM | POA: Diagnosis not present

## 2020-09-12 DIAGNOSIS — E11621 Type 2 diabetes mellitus with foot ulcer: Secondary | ICD-10-CM | POA: Diagnosis not present

## 2020-09-12 DIAGNOSIS — M545 Low back pain, unspecified: Secondary | ICD-10-CM | POA: Diagnosis not present

## 2020-09-12 MED ORDER — LEVOFLOXACIN 500 MG PO TABS: 500.0000 mg | ORAL_TABLET | Freq: Every day | ORAL | 0 refills | Status: AC

## 2020-09-12 NOTE — Telephone Encounter (Signed)
I sent Levaquin antibiotic to her pharmacy. -Dr. Cannon Kettle

## 2020-09-12 NOTE — Progress Notes (Signed)
Refilled levaquin

## 2020-09-12 NOTE — Telephone Encounter (Signed)
Pt notified/reb °

## 2020-09-15 DIAGNOSIS — I509 Heart failure, unspecified: Secondary | ICD-10-CM | POA: Diagnosis not present

## 2020-09-15 DIAGNOSIS — R0902 Hypoxemia: Secondary | ICD-10-CM | POA: Diagnosis not present

## 2020-09-15 DIAGNOSIS — J449 Chronic obstructive pulmonary disease, unspecified: Secondary | ICD-10-CM | POA: Diagnosis not present

## 2020-09-17 ENCOUNTER — Other Ambulatory Visit: Payer: Self-pay | Admitting: Cardiology

## 2020-09-17 ENCOUNTER — Ambulatory Visit: Payer: Medicare Other | Admitting: Sports Medicine

## 2020-09-29 DIAGNOSIS — J449 Chronic obstructive pulmonary disease, unspecified: Secondary | ICD-10-CM | POA: Diagnosis not present

## 2020-09-30 DEATH — deceased
